# Patient Record
Sex: Female | Born: 1985 | Race: White | Hispanic: No | Marital: Married | Smoking: Never smoker
Health system: Southern US, Community
[De-identification: ages and names within clinical notes are randomized; demographics above are authoritative.]

## PROBLEM LIST (undated history)

## (undated) DIAGNOSIS — B3781 Candidal esophagitis: Secondary | ICD-10-CM

## (undated) DIAGNOSIS — J45909 Unspecified asthma, uncomplicated: Secondary | ICD-10-CM

## (undated) DIAGNOSIS — K219 Gastro-esophageal reflux disease without esophagitis: Secondary | ICD-10-CM

## (undated) DIAGNOSIS — K224 Dyskinesia of esophagus: Secondary | ICD-10-CM

## (undated) DIAGNOSIS — R1110 Vomiting, unspecified: Secondary | ICD-10-CM

## (undated) DIAGNOSIS — D131 Benign neoplasm of stomach: Secondary | ICD-10-CM

## (undated) DIAGNOSIS — K2970 Gastritis, unspecified, without bleeding: Secondary | ICD-10-CM

## (undated) HISTORY — PX: WISDOM TOOTH EXTRACTION: SHX21

---

## 2006-01-18 ENCOUNTER — Emergency Department: Payer: Self-pay | Admitting: Obstetrics & Gynecology

## 2006-06-24 ENCOUNTER — Observation Stay: Payer: Self-pay

## 2006-06-26 ENCOUNTER — Observation Stay: Payer: Self-pay | Admitting: Obstetrics & Gynecology

## 2006-07-06 ENCOUNTER — Inpatient Hospital Stay: Payer: Self-pay | Admitting: Obstetrics and Gynecology

## 2006-07-10 ENCOUNTER — Observation Stay: Payer: Self-pay | Admitting: Obstetrics and Gynecology

## 2009-01-09 ENCOUNTER — Emergency Department (HOSPITAL_COMMUNITY): Admission: EM | Admit: 2009-01-09 | Discharge: 2009-01-09 | Payer: Self-pay | Admitting: Emergency Medicine

## 2012-05-03 ENCOUNTER — Emergency Department: Payer: Self-pay | Admitting: Emergency Medicine

## 2012-05-03 LAB — URINALYSIS, COMPLETE
Glucose,UR: NEGATIVE mg/dL (ref 0–75)
Nitrite: NEGATIVE
Ph: 5 (ref 4.5–8.0)

## 2012-05-03 LAB — CBC
HCT: 38.2 % (ref 35.0–47.0)
MCH: 32 pg (ref 26.0–34.0)
MCHC: 34.8 g/dL (ref 32.0–36.0)
MCV: 92 fL (ref 80–100)
RBC: 4.15 10*6/uL (ref 3.80–5.20)
WBC: 8.3 10*3/uL (ref 3.6–11.0)

## 2012-05-04 LAB — WET PREP, GENITAL

## 2012-05-06 ENCOUNTER — Other Ambulatory Visit: Payer: Self-pay | Admitting: Emergency Medicine

## 2012-06-05 ENCOUNTER — Ambulatory Visit: Payer: Self-pay | Admitting: Internal Medicine

## 2012-06-05 LAB — CBC WITH DIFFERENTIAL/PLATELET
Basophil %: 0.4 %
HCT: 40.5 % (ref 35.0–47.0)
HGB: 13.8 g/dL (ref 12.0–16.0)
Lymphocyte %: 27.1 %
MCHC: 34 g/dL (ref 32.0–36.0)
Monocyte %: 6.2 %
Neutrophil #: 4.5 10*3/uL (ref 1.4–6.5)
RBC: 4.31 10*6/uL (ref 3.80–5.20)
WBC: 7 10*3/uL (ref 3.6–11.0)

## 2012-06-05 LAB — URINALYSIS, COMPLETE
Glucose,UR: NEGATIVE mg/dL (ref 0–75)
Nitrite: NEGATIVE
Ph: 6.5 (ref 4.5–8.0)
Protein: NEGATIVE

## 2012-06-05 LAB — COMPREHENSIVE METABOLIC PANEL
Albumin: 3.6 g/dL (ref 3.4–5.0)
Alkaline Phosphatase: 9 U/L — ABNORMAL LOW (ref 50–136)
BUN: 9 mg/dL (ref 7–18)
Bilirubin,Total: 0.6 mg/dL (ref 0.2–1.0)
Chloride: 103 mmol/L (ref 98–107)
Creatinine: 0.83 mg/dL (ref 0.60–1.30)
Glucose: 82 mg/dL (ref 65–99)
Osmolality: 273 (ref 275–301)
SGOT(AST): 13 U/L — ABNORMAL LOW (ref 15–37)
SGPT (ALT): 19 U/L (ref 12–78)
Total Protein: 7.3 g/dL (ref 6.4–8.2)

## 2012-06-05 LAB — TSH: Thyroid Stimulating Horm: 0.013 u[IU]/mL — ABNORMAL LOW

## 2012-06-05 LAB — AMYLASE: Amylase: 42 U/L (ref 25–115)

## 2012-06-09 ENCOUNTER — Ambulatory Visit: Payer: Self-pay | Admitting: Internal Medicine

## 2012-06-24 ENCOUNTER — Encounter: Payer: Self-pay | Admitting: Maternal and Fetal Medicine

## 2012-09-14 ENCOUNTER — Observation Stay: Payer: Self-pay | Admitting: Obstetrics and Gynecology

## 2012-12-25 ENCOUNTER — Observation Stay: Payer: Self-pay | Admitting: Obstetrics and Gynecology

## 2012-12-25 LAB — PIH PROFILE
Anion Gap: 11 (ref 7–16)
Calcium, Total: 8.7 mg/dL (ref 8.5–10.1)
Co2: 21 mmol/L (ref 21–32)
HGB: 11 g/dL — ABNORMAL LOW (ref 12.0–16.0)
MCH: 32 pg (ref 26.0–34.0)
MCHC: 34.6 g/dL (ref 32.0–36.0)
MCV: 93 fL (ref 80–100)
Platelet: 223 10*3/uL (ref 150–440)
Potassium: 3.4 mmol/L — ABNORMAL LOW (ref 3.5–5.1)
RDW: 13.4 % (ref 11.5–14.5)
SGOT(AST): 16 U/L (ref 15–37)

## 2013-01-02 ENCOUNTER — Inpatient Hospital Stay: Payer: Self-pay | Admitting: Obstetrics and Gynecology

## 2013-01-02 LAB — CBC WITH DIFFERENTIAL/PLATELET
Basophil #: 0.1 10*3/uL (ref 0.0–0.1)
Basophil %: 0.9 %
Eosinophil #: 0.2 10*3/uL (ref 0.0–0.7)
Eosinophil %: 2.2 %
Lymphocyte #: 1.5 10*3/uL (ref 1.0–3.6)
Lymphocyte %: 19.8 %
MCHC: 34.3 g/dL (ref 32.0–36.0)
MCV: 93 fL (ref 80–100)
Monocyte %: 7.3 %
Neutrophil #: 5.2 10*3/uL (ref 1.4–6.5)
RDW: 13.5 % (ref 11.5–14.5)

## 2013-01-02 LAB — PROTEIN / CREATININE RATIO, URINE
Creatinine, Urine: 124.1 mg/dL (ref 30.0–125.0)
Protein, Random Urine: 14 mg/dL — ABNORMAL HIGH (ref 0–12)

## 2013-01-03 LAB — HEMATOCRIT: HCT: 30.9 % — ABNORMAL LOW (ref 35.0–47.0)

## 2013-07-20 ENCOUNTER — Other Ambulatory Visit: Payer: Self-pay | Admitting: Physician Assistant

## 2013-07-22 LAB — URINE CULTURE

## 2013-10-27 ENCOUNTER — Ambulatory Visit: Payer: Self-pay | Admitting: Family Medicine

## 2013-12-03 ENCOUNTER — Emergency Department: Payer: Self-pay | Admitting: Emergency Medicine

## 2013-12-03 LAB — COMPREHENSIVE METABOLIC PANEL
ALBUMIN: 4.2 g/dL (ref 3.4–5.0)
AST: 22 U/L (ref 15–37)
Alkaline Phosphatase: 78 U/L
Anion Gap: 4 — ABNORMAL LOW (ref 7–16)
BILIRUBIN TOTAL: 0.3 mg/dL (ref 0.2–1.0)
BUN: 12 mg/dL (ref 7–18)
CHLORIDE: 112 mmol/L — AB (ref 98–107)
CREATININE: 0.92 mg/dL (ref 0.60–1.30)
Calcium, Total: 8.6 mg/dL (ref 8.5–10.1)
Co2: 23 mmol/L (ref 21–32)
EGFR (African American): >60
Glucose: 97 mg/dL (ref 65–99)
Osmolality: 277 (ref 275–301)
Potassium: 3.8 mmol/L (ref 3.5–5.1)
SGPT (ALT): 22 U/L (ref 12–78)
SODIUM: 139 mmol/L (ref 136–145)
Total Protein: 7.9 g/dL (ref 6.4–8.2)

## 2013-12-03 LAB — URINALYSIS, COMPLETE
BACTERIA: NONE SEEN
BILIRUBIN, UR: NEGATIVE
GLUCOSE, UR: NEGATIVE mg/dL (ref 0–75)
Ketone: NEGATIVE
Leukocyte Esterase: NEGATIVE
NITRITE: NEGATIVE
Ph: 6 (ref 4.5–8.0)
Protein: NEGATIVE
RBC,UR: 4 /HPF (ref 0–5)
Specific Gravity: 1.023 (ref 1.003–1.030)
WBC UR: 2 /HPF (ref 0–5)

## 2013-12-03 LAB — CBC WITH DIFFERENTIAL/PLATELET
BASOS ABS: 0 10*3/uL (ref 0.0–0.1)
Basophil %: 0.8 %
EOS ABS: 0.4 10*3/uL (ref 0.0–0.7)
Eosinophil %: 6.8 %
HCT: 41.9 % (ref 35.0–47.0)
HGB: 13.7 g/dL (ref 12.0–16.0)
LYMPHS ABS: 2.5 10*3/uL (ref 1.0–3.6)
LYMPHS PCT: 43 %
MCH: 28.9 pg (ref 26.0–34.0)
MCHC: 32.8 g/dL (ref 32.0–36.0)
MCV: 88 fL (ref 80–100)
MONO ABS: 0.5 x10 3/mm (ref 0.2–0.9)
Monocyte %: 8.7 %
NEUTROS PCT: 40.7 %
Neutrophil #: 2.4 10*3/uL (ref 1.4–6.5)
PLATELETS: 200 10*3/uL (ref 150–440)
RBC: 4.76 10*6/uL (ref 3.80–5.20)
RDW: 13.3 % (ref 11.5–14.5)
WBC: 5.9 10*3/uL (ref 3.6–11.0)

## 2013-12-03 LAB — LIPASE, BLOOD: LIPASE: 124 U/L (ref 73–393)

## 2013-12-03 LAB — PREGNANCY, URINE: PREGNANCY TEST, URINE: NEGATIVE m[IU]/mL

## 2014-04-03 IMAGING — US US OB < 14 WEEKS - US OB TV
1 series · 14 of 28 positions shown · non-contrast
Comparison: none

REASON FOR EXAM: 1ST TRIMESTER PREG, VAG BLEEDING
COMMENTS:   May transport without cardiac monitor

PROCEDURE:     US  - US OB LESS THAN 14 WEEKS/W TRANS  - May 04, 2012 [DATE]
RESULT:
TECHNIQUE: Transabdominal and endovaginal imaging of the pelvis was obtained.

[Series 1: us ob < 14 weeks - us ob tv · 0.30mm/px · 14 of 40 slices shown]
[im 2/40]
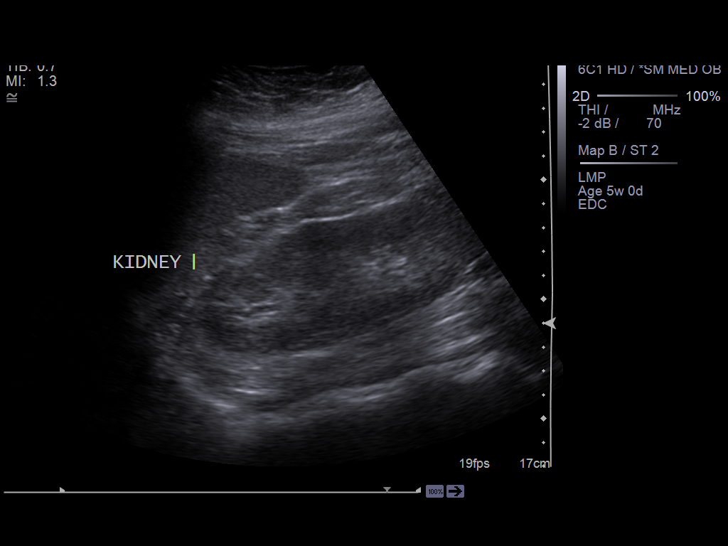
[im 5/40]
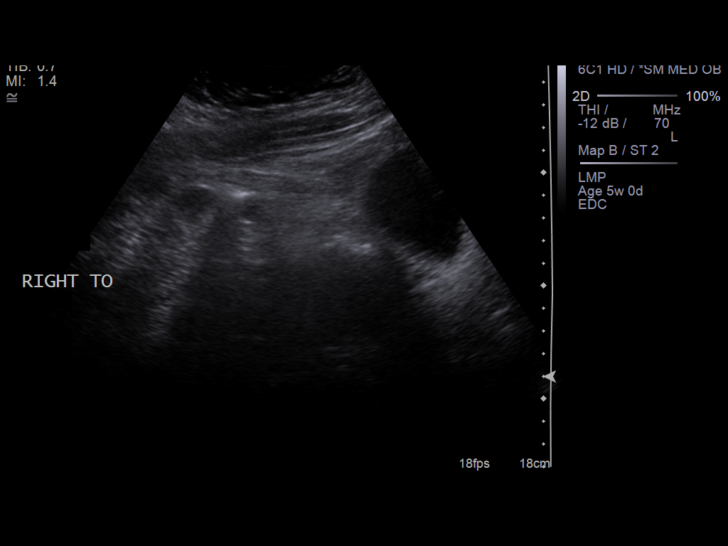
[im 8/40]
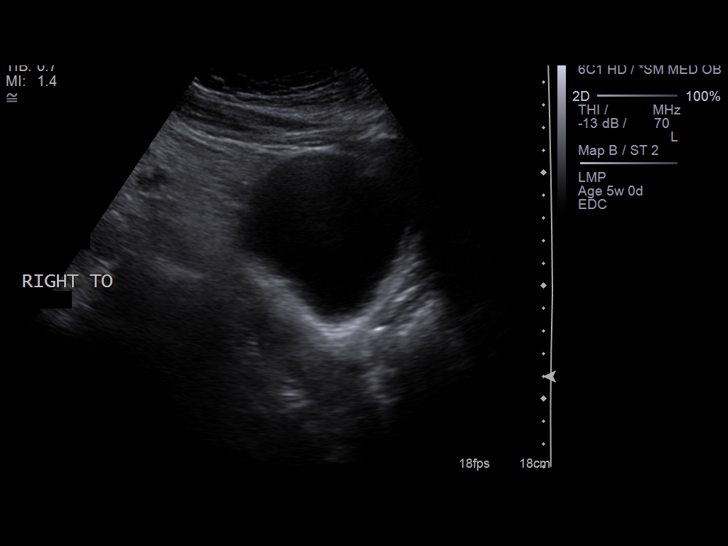
[im 11/40]
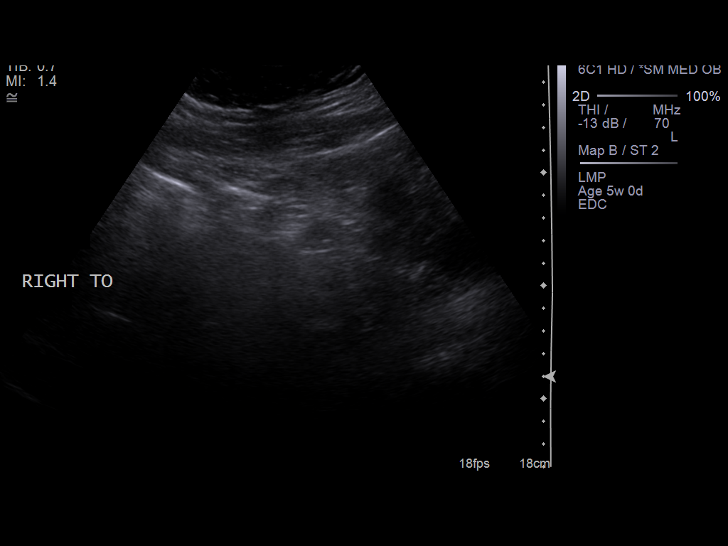
[im 14/40]
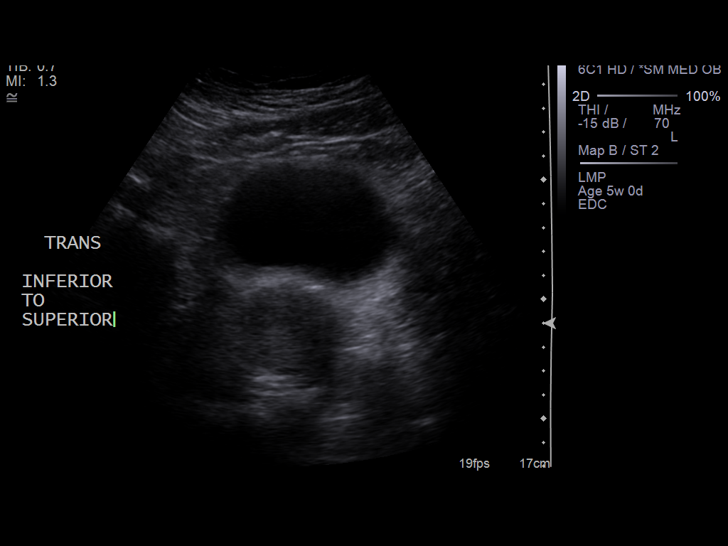
[im 16/40]
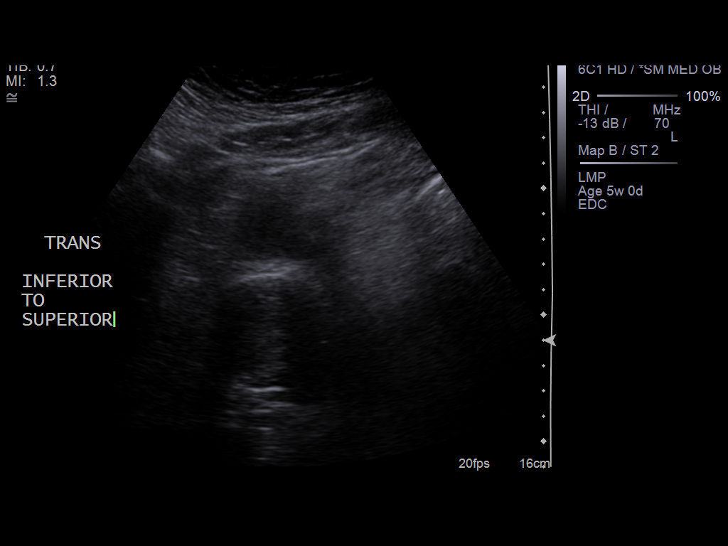
[im 19/40]
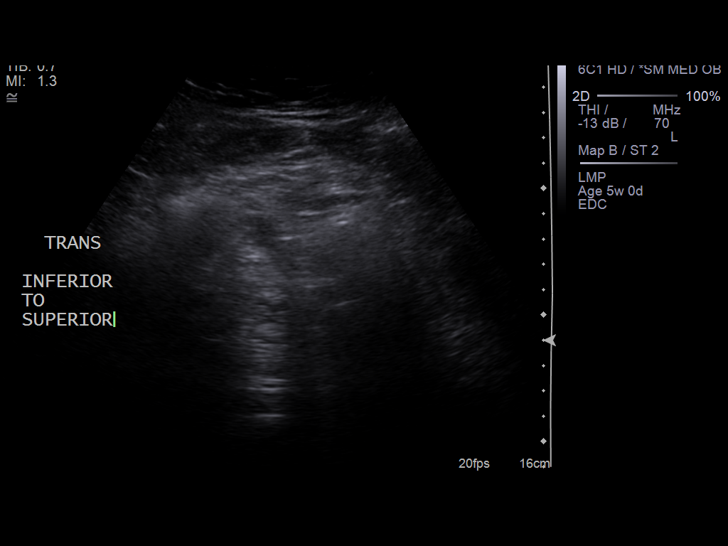
[im 22/40]
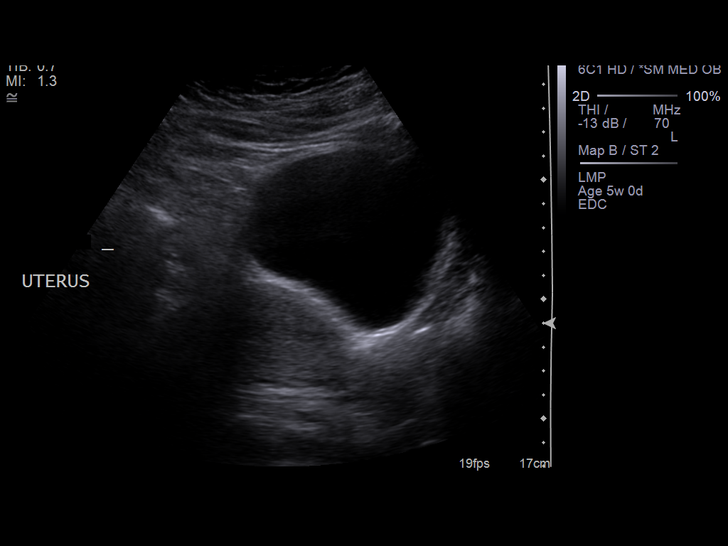
[im 25/40]
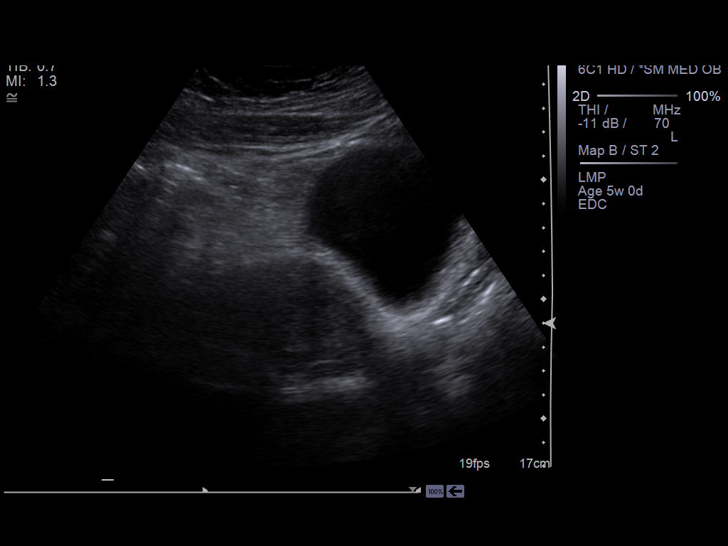
[im 28/40]
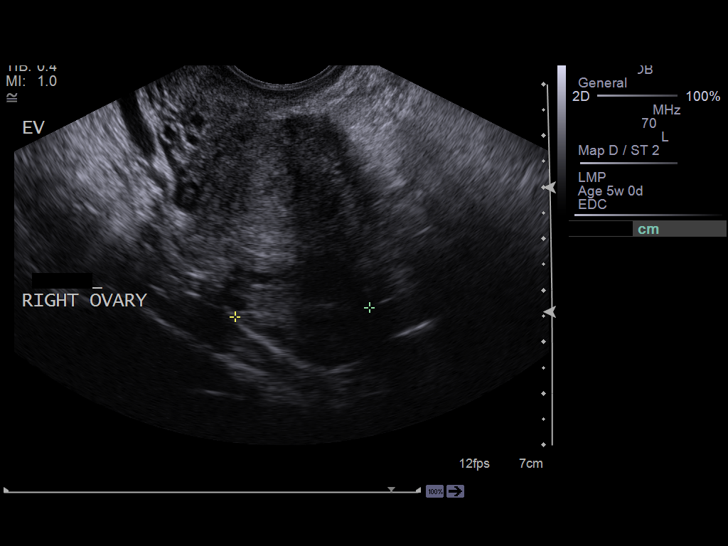
[im 31/40]
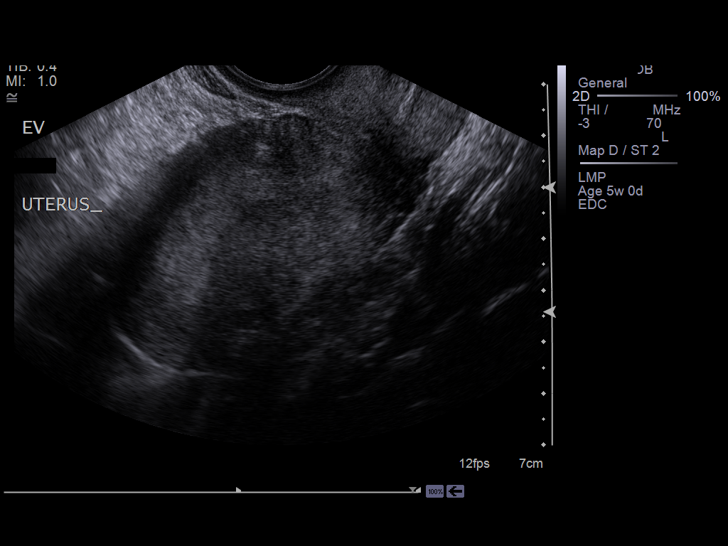
[im 34/40]
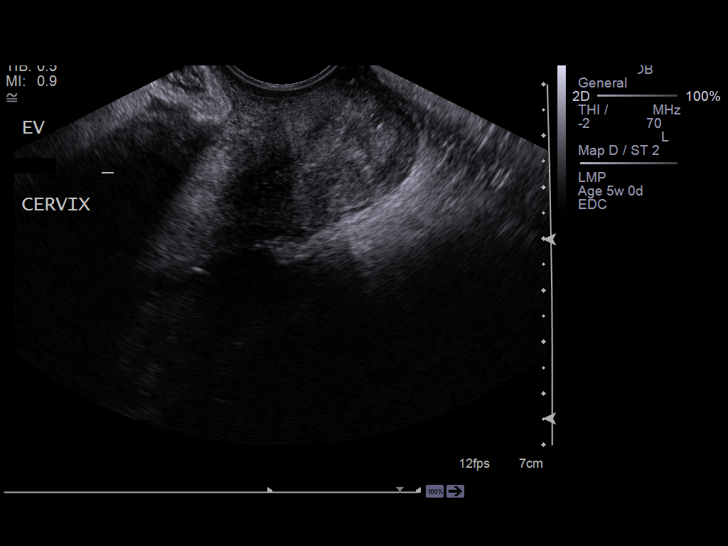
[im 37/40]
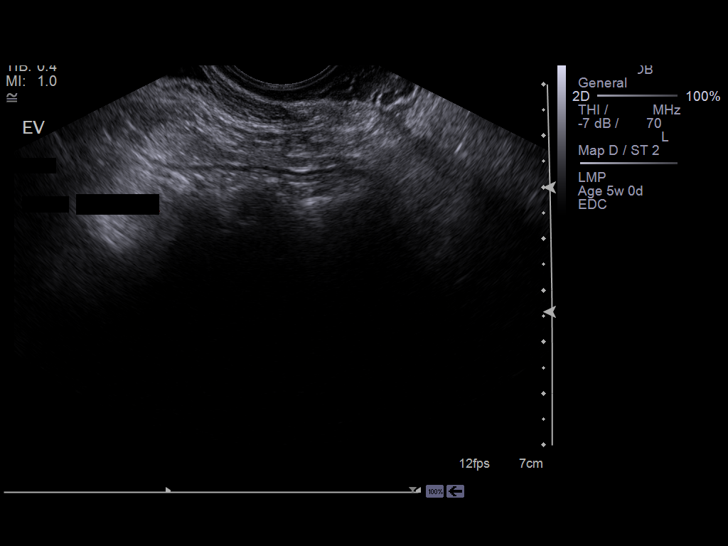
[im 40/40]
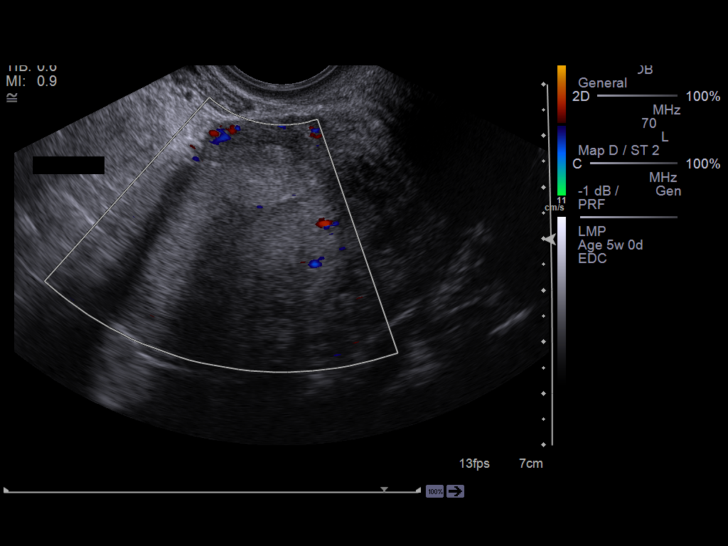

[14 of 28 positions shown; findings below may reference images not displayed]

FINDINGS: The uterus demonstrates a homogeneous echotexture with an
endometrial thickness of 1.15 cm. There is no sonographic evidence of an
intrauterine gestational sac. The ovaries are unremarkable with the right
measuring 3.57 x 2.03 x 2.61 cm and the left not visualized secondary to
bowel gas. A small amount fluid is appreciated within the cul-de-sac.
IMPRESSION: 1. No sonographic evidence of an intrauterine pregnancy.
2. An intrauterine or ectopic pregnancy cannot be excluded or confirmed from
this study at this time. Correlation with a serial beta-hCG is recommended.
3. Dr. Elsie of the Emergency Department was informed of these findings
via a preliminary faxed report.

## 2014-10-05 ENCOUNTER — Ambulatory Visit: Payer: Self-pay | Admitting: Physician Assistant

## 2015-01-02 ENCOUNTER — Encounter: Payer: Self-pay | Admitting: *Deleted

## 2015-02-06 NOTE — H&P (Signed)
L&D Evaluation:  History:  HPI 29 yo G2P1001 with LMP of 04/01/12? & EDD of 01/06/13 [facility] with "elevated BP today at work of 140/96 and spots in front of her eyes". Pt was working as an Customer service manager. Denies any edema or HA or RUQ pain today.   Presents with elevated BP, hx of pre-ecclampsia   Patient's Medical History asthma,Hyperemesis,   Patient's Surgical History 2 wisdom teeth   Medications Pre Natal Vitamins   Allergies NKDA   Social History none   Family History Non-Contributory   ROS:  ROS All systems were reviewed.  HEENT, CNS, GI, GU, Respiratory, CV, Renal and Musculoskeletal systems were found to be normal.   Exam:  Vital Signs stable  Bp 139/76   General no apparent distress   Mental Status clear   Chest clear   Heart normal sinus rhythm, no murmur/gallop/rubs   Abdomen gravid, non-tender   Estimated Fetal Weight Average for gestational age   Fetal Position vtx   Back no CVAT   Edema 1+   Reflexes 1+   Pelvic 2/50/vtx-2   Mebranes Intact   FHT normal rate with no decels, reactive NST with 2 accels 15 x 15   Ucx absent   Skin dry   Lymph no lymphadenopathy   Impression:  Impression Hest HYN at 39 3/7 weeks   Plan:  Plan monitor contractions and for cervical change, Prot/creat ratio is 113   Comments Disc IOL due to spots in front of eyes and elevated BP's vs Bedrest and IOL as planned. Pt desires to stay and be induced today. Pt is aware of the risks, benfits and alternatives including bleeding, infection, fetal or uterine intolerance.   Electronic Signatures: Catheryn Bacon (CNM)  (Signed 06-Apr-14 09:48)  Authored: L&D Evaluation   Last Updated: 06-Apr-14 09:48 by Catheryn Bacon (CNM)

## 2015-02-06 NOTE — H&P (Signed)
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  HEENT, CNS, GI, GU, Respiratory, CV, Renal and Musculoskeletal systems were found to be normal.   Exam:   Vital Signs stable    Abdomen gravid, non-tender    Estimated Fetal Weight Average for gestational age    Pelvic closed/parous/OOP    FHT normal rate with no decels    Ucx absent   Impression:   Impression VB at 23 weeks   Plan:   Comments Note for work today No lifting at work for remainder of pregnancy Pelvic rest for 1 week F/U KC next 1-2 weeks   Electronic Signatures: Edison Nasuti (MD)  (Signed 17-Dec-13 03:12)  Authored: L&D Evaluation   Last Updated: 17-Dec-13 03:12 by Edison Nasuti (MD)

## 2015-05-23 ENCOUNTER — Other Ambulatory Visit: Payer: Self-pay | Admitting: Physician Assistant

## 2015-05-23 DIAGNOSIS — R1319 Other dysphagia: Secondary | ICD-10-CM

## 2015-05-25 ENCOUNTER — Ambulatory Visit: Admission: RE | Admit: 2015-05-25 | Payer: Self-pay | Source: Ambulatory Visit

## 2015-06-01 ENCOUNTER — Ambulatory Visit
Admission: RE | Admit: 2015-06-01 | Discharge: 2015-06-01 | Disposition: A | Discharge status: Home / Self Care | Payer: BLUE CROSS/BLUE SHIELD | Source: Ambulatory Visit | Attending: Physician Assistant | Admitting: Physician Assistant

## 2015-06-01 DIAGNOSIS — R1319 Other dysphagia: Secondary | ICD-10-CM | POA: Insufficient documentation

## 2015-06-25 ENCOUNTER — Encounter: Payer: Self-pay | Admitting: *Deleted

## 2015-06-26 ENCOUNTER — Ambulatory Visit: Payer: BLUE CROSS/BLUE SHIELD | Admitting: Anesthesiology

## 2015-06-26 ENCOUNTER — Ambulatory Visit
Admission: RE | Admit: 2015-06-26 | Discharge: 2015-06-26 | Disposition: A | Discharge status: Home / Self Care | Payer: BLUE CROSS/BLUE SHIELD | Source: Ambulatory Visit | Attending: Gastroenterology | Admitting: Gastroenterology

## 2015-06-26 ENCOUNTER — Encounter
Admission: RE | Disposition: A | Discharge status: Home / Self Care | Payer: Self-pay | Source: Ambulatory Visit | Attending: Gastroenterology

## 2015-06-26 ENCOUNTER — Encounter: Payer: Self-pay | Admitting: Anesthesiology

## 2015-06-26 DIAGNOSIS — J45909 Unspecified asthma, uncomplicated: Secondary | ICD-10-CM | POA: Insufficient documentation

## 2015-06-26 DIAGNOSIS — B3781 Candidal esophagitis: Secondary | ICD-10-CM | POA: Insufficient documentation

## 2015-06-26 DIAGNOSIS — Z79899 Other long term (current) drug therapy: Secondary | ICD-10-CM | POA: Insufficient documentation

## 2015-06-26 DIAGNOSIS — Z7951 Long term (current) use of inhaled steroids: Secondary | ICD-10-CM | POA: Insufficient documentation

## 2015-06-26 DIAGNOSIS — K2950 Unspecified chronic gastritis without bleeding: Secondary | ICD-10-CM | POA: Diagnosis not present

## 2015-06-26 DIAGNOSIS — K317 Polyp of stomach and duodenum: Secondary | ICD-10-CM | POA: Diagnosis not present

## 2015-06-26 DIAGNOSIS — K200 Eosinophilic esophagitis: Secondary | ICD-10-CM | POA: Diagnosis not present

## 2015-06-26 DIAGNOSIS — R1310 Dysphagia, unspecified: Secondary | ICD-10-CM | POA: Insufficient documentation

## 2015-06-26 DIAGNOSIS — K219 Gastro-esophageal reflux disease without esophagitis: Secondary | ICD-10-CM | POA: Insufficient documentation

## 2015-06-26 DIAGNOSIS — K224 Dyskinesia of esophagus: Secondary | ICD-10-CM | POA: Insufficient documentation

## 2015-06-26 HISTORY — DX: Vomiting, unspecified: R11.10

## 2015-06-26 HISTORY — DX: Unspecified asthma, uncomplicated: J45.909

## 2015-06-26 HISTORY — DX: Gastro-esophageal reflux disease without esophagitis: K21.9

## 2015-06-26 HISTORY — PX: ESOPHAGOGASTRODUODENOSCOPY (EGD) WITH PROPOFOL: SHX5813

## 2015-06-26 LAB — KOH PREP: KOH PREP: POSITIVE

## 2015-06-26 LAB — POCT PREGNANCY, URINE: PREG TEST UR: NEGATIVE

## 2015-06-26 SURGERY — ESOPHAGOGASTRODUODENOSCOPY (EGD) WITH PROPOFOL
Anesthesia: General

## 2015-06-26 MED ORDER — MIDAZOLAM HCL 2 MG/2ML IJ SOLN
INTRAMUSCULAR | Status: DC | PRN
  Administered 2015-06-26: 1 mg via INTRAVENOUS

## 2015-06-26 MED ORDER — IPRATROPIUM-ALBUTEROL 0.5-2.5 (3) MG/3ML IN SOLN
RESPIRATORY_TRACT | Status: AC
  Administered 2015-06-26: 3 mL via RESPIRATORY_TRACT
  Filled 2015-06-26: qty 3

## 2015-06-26 MED ORDER — IPRATROPIUM-ALBUTEROL 0.5-2.5 (3) MG/3ML IN SOLN
3.0000 mL | Freq: Once | RESPIRATORY_TRACT | Status: AC
  Administered 2015-06-26: 3 mL via RESPIRATORY_TRACT

## 2015-06-26 MED ORDER — FENTANYL CITRATE (PF) 100 MCG/2ML IJ SOLN
INTRAMUSCULAR | Status: DC | PRN
  Administered 2015-06-26: 50 ug via INTRAVENOUS

## 2015-06-26 MED ORDER — SODIUM CHLORIDE 0.9 % IV SOLN
INTRAVENOUS | Status: DC
  Administered 2015-06-26: 1000 mL via INTRAVENOUS

## 2015-06-26 MED ORDER — PROPOFOL 500 MG/50ML IV EMUL
INTRAVENOUS | Status: DC | PRN
  Administered 2015-06-26: 120 ug/kg/min via INTRAVENOUS

## 2015-06-26 MED ORDER — GLYCOPYRROLATE 0.2 MG/ML IJ SOLN
INTRAMUSCULAR | Status: DC | PRN
  Administered 2015-06-26: 0.1 mg via INTRAVENOUS

## 2015-06-26 MED ORDER — LIDOCAINE HCL (CARDIAC) 20 MG/ML IV SOLN
INTRAVENOUS | Status: DC | PRN
  Administered 2015-06-26: 40 mg via INTRAVENOUS

## 2015-06-26 NOTE — Anesthesia Postprocedure Evaluation (Signed)
  Anesthesia Post-op Note  Patient: Paula Padilla  Procedure(s) Performed: Procedure(s): ESOPHAGOGASTRODUODENOSCOPY (EGD) WITH PROPOFOL (N/A)  Anesthesia type:General  Patient location: PACU  Post pain: Pain level controlled  Post assessment: Post-op Vital signs reviewed, Patient's Cardiovascular Status Stable, Respiratory Function Stable, Patent Airway and No signs of Nausea or vomiting  Post vital signs: Reviewed and stable  Last Vitals:  Filed Vitals:   06/26/15 1140  BP: 147/101  Pulse: 78  Temp:   Resp: 19    Level of consciousness: awake, alert  and patient cooperative  Complications: No apparent anesthesia complications

## 2015-06-26 NOTE — Anesthesia Procedure Notes (Signed)
Performed by: COOK-MARTIN, Isel Skufca Pre-anesthesia Checklist: Patient identified, Emergency Drugs available, Suction available, Patient being monitored and Timeout performed Patient Re-evaluated:Patient Re-evaluated prior to inductionOxygen Delivery Method: Nasal cannula Preoxygenation: Pre-oxygenation with 100% oxygen Intubation Type: IV induction Airway Equipment and Method: Bite block Placement Confirmation: positive ETCO2 and CO2 detector     

## 2015-06-26 NOTE — Op Note (Signed)
[facility], [place] Gastroenterology Patient Name: Paula Padilla Procedure Date: 06/26/2015 10:45 AM MRN: [id number] Account #: [id number] Date of Birth: 1986-01-17 Admit Type: Outpatient Age: 29 Room: [facility] ENDO ROOM 3 Gender: Female Note Status: Finalized Procedure:         Upper GI endoscopy Indications:       Dysphagia Providers:         Lollie Sails, MD Referring MD:      Precious Bard, MD (Referring MD) Medicines:         Monitored Anesthesia Care Complications:     No immediate complications. Procedure:         Pre-Anesthesia Assessment:                    - ASA Grade Assessment: III - A patient with severe                     systemic disease.                    After obtaining informed consent, the endoscope was passed                     under direct vision. Throughout the procedure, the                     patient's blood pressure, pulse, and oxygen saturations                     were monitored continuously. The Olympus GIF-160 endoscope                     (S#. [id number]) was introduced through the mouth, and                     advanced to the third part of duodenum. The upper GI                     endoscopy was accomplished without difficulty. The patient                     tolerated the procedure well. Findings:      Patchy candidiasis was found in the upper third of the esophagus and in       the middle third of the esophagus. Cells for cytology were obtained by       brushing.      LA Grade B (one or more mucosal breaks greater than 5 mm, not extending       between the tops of two mucosal folds) esophagitis with no bleeding was       found.      Abnormal motility was noted in the middle third of the esophagus and in       the lower third of the esophagus. The cricopharyngeus was normal. There       are extra peristaltic waves of the esophageal body. Tertiary peristaltic       waves are noted.      Mildly friable mucosa with contact  bleeding was found in the gastric       body.      The exam of the stomach was otherwise normal.      The cardia and gastric fundus were normal on retroflexion.      Patchy mucosal flattening was found at 2nd part of the duodenum and at  3rd part of the duodenum. Biopsies were taken with a cold forceps for       histology.      Biopsies were taken with a cold forceps in the middle third of the       esophagus for histology. Impression:        - Monilial esophagitis. Cells for cytology obtained.                    - LA Grade B erosive esophagitis.                    - Esophageal motility disorder.                    - Friable gastric mucosa.                    - Flattened mucosa was found in the duodenum, not                     consistent with celiac disease. Biopsied. Recommendation:    - Use Protonix (pantoprazole) 40 mg PO daily daily.                    - Diflucan (fluconazole) 100 mg PO daily for 5 days.                    - Return to GI clinic in 3 weeks. Procedure Code(s): --- Professional ---                    [phone], Esophagogastroduodenoscopy, flexible, transoral;                     with biopsy, single or multiple Diagnosis Code(s): --- Professional ---                    112.84, Candidal esophagitis                    530.19, Other esophagitis                    530.5, Dyskinesia of esophagus                    537.9, Unspecified disorder of stomach and duodenum                    787.20, Dysphagia, unspecified CPT copyright 2014 American Medical Association. All rights reserved. The codes documented in this report are preliminary and upon coder review may  be revised to meet current compliance requirements. Lollie Sails, MD 06/26/2015 11:16:07 AM This report has been signed electronically. Number of Addenda: 0 Note Initiated On: 06/26/2015 10:45 AM      [facility]

## 2015-06-26 NOTE — Transfer of Care (Signed)
Immediate Anesthesia Transfer of Care Note  Patient: Paula Padilla  Procedure(s) Performed: Procedure(s): ESOPHAGOGASTRODUODENOSCOPY (EGD) WITH PROPOFOL (N/A)  Patient Location: PACU  Anesthesia Type:General  Level of Consciousness: awake, alert , oriented and sedated  Airway & Oxygen Therapy: Patient Spontanous Breathing and Patient connected to nasal cannula oxygen  Post-op Assessment: Report given to RN and Post -op Vital signs reviewed and stable  Post vital signs: Reviewed and stable  Last Vitals:  Filed Vitals:   06/26/15 0937  BP: 146/99  Pulse: 107  Temp: 37.6 C  Resp: 16    Complications: No apparent anesthesia complications

## 2015-06-26 NOTE — Anesthesia Preprocedure Evaluation (Addendum)
Anesthesia Evaluation  Patient identified by MRN, date of birth, ID band Patient awake    Reviewed: Allergy & Precautions, H&P , NPO status , Patient's Chart, lab work & pertinent test results  History of Anesthesia Complications Negative for: history of anesthetic complications  Airway Mallampati: II  TM Distance: >3 FB Neck ROM: full    Dental no notable dental hx. (+) Teeth Intact   Pulmonary neg shortness of breath, asthma ,    Pulmonary exam normal breath sounds clear to auscultation       Cardiovascular Exercise Tolerance: Good (-) Past MI negative cardio ROS Normal cardiovascular exam Rhythm:regular Rate:Normal     Neuro/Psych negative neurological ROS  negative psych ROS   GI/Hepatic Neg liver ROS, GERD  Controlled,  Endo/Other  negative endocrine ROS  Renal/GU negative Renal ROS  negative genitourinary   Musculoskeletal   Abdominal   Peds  Hematology negative hematology ROS (+)   Anesthesia Other Findings Past Medical History:   Asthma                                                       GERD (gastroesophageal reflux disease)                       Hyperemesis                                                 Patient reports that she does that think that any food or pills are stuck in her throat at this time.   Reproductive/Obstetrics negative OB ROS                             Anesthesia Physical Anesthesia Plan  ASA: III  Anesthesia Plan: General   Post-op Pain Management:    Induction:   Airway Management Planned:   Additional Equipment:   Intra-op Plan:   Post-operative Plan:   Informed Consent: I have reviewed the patients History and Physical, chart, labs and discussed the procedure including the risks, benefits and alternatives for the proposed anesthesia with the patient or authorized representative who has indicated his/her understanding and acceptance.    Dental Advisory Given  Plan Discussed with: Anesthesiologist, CRNA and Surgeon  Anesthesia Plan Comments:         Anesthesia Quick Evaluation

## 2015-06-27 LAB — SURGICAL PATHOLOGY

## 2015-06-28 ENCOUNTER — Encounter: Payer: Self-pay | Admitting: Gastroenterology

## 2015-06-29 NOTE — H&P (Signed)
Outpatient short stay form Pre-procedure 06/29/2015 6:20 PM this is a delayed note her procedure date of 06/26/2015 Paula Sails MD  Primary Physician: Rolan Lipa NP  Reason for visit:  EGD  History of present illness:  Patient is a 29 year old female presenting for EGD. He is been having some difficulty with esophageal reflux and some amount of dysphagia. As an outpatient she had a barium swallow, Helicobacter pylori antibody serologies, and referral for this procedure.  The barium swallow showed some decreased peristalsis however no evidence of reflux mass or ulceration. A 13 mm tablet briefly in the pharynx pass freely into the stomach from there after several boluses of water.  Back to pylori serology was negative.   No current facility-administered medications for this encounter.  Current outpatient prescriptions:  .  albuterol (PROVENTIL HFA;VENTOLIN HFA) 108 (90 BASE) MCG/ACT inhaler, Inhale 2 puffs into the lungs every 6 (six) hours as needed for wheezing or shortness of breath., Disp: , Rfl:  .  Azelastine-Fluticasone 137-50 MCG/ACT SUSP, Place 2 sprays into the nose daily., Disp: , Rfl:  .  pantoprazole (PROTONIX) 40 MG tablet, Take 40 mg by mouth 2 (two) times daily., Disp: , Rfl:   No prescriptions prior to admission     No Known Allergies   Past Medical History  Diagnosis Date  . Asthma   . GERD (gastroesophageal reflux disease)   . Hyperemesis     Review of systems:      Physical Exam    Heart and lungs: Regular rate and rhythm without rub or gallop, lungs are bilaterally clear    HEENT: Norm cephalic atraumatic eyes are anicteric    Other:     Pertinant exam for procedure: Soft nontender nondistended bowel sounds positive    Planned proceedures: EGD and indicated procedures I have discussed the risks benefits and complications of procedures to include not limited to bleeding, infection, perforation and the risk of sedation and the patient  wishes to proceed.    Paula Sails, MD Gastroenterology 06/29/2015  6:20 PM

## 2015-08-22 ENCOUNTER — Other Ambulatory Visit
Admission: RE | Admit: 2015-08-22 | Discharge: 2015-08-22 | Disposition: A | Discharge status: Home / Self Care | Payer: BLUE CROSS/BLUE SHIELD | Source: Ambulatory Visit | Attending: Otolaryngology | Admitting: Otolaryngology

## 2015-08-22 ENCOUNTER — Encounter: Payer: Self-pay | Admitting: *Deleted

## 2015-08-27 ENCOUNTER — Encounter
Admission: RE | Disposition: A | Discharge status: Home / Self Care | Payer: Self-pay | Source: Ambulatory Visit | Attending: Gastroenterology

## 2015-08-27 ENCOUNTER — Other Ambulatory Visit
Admission: RE | Admit: 2015-08-27 | Discharge: 2015-08-27 | Disposition: A | Discharge status: Home / Self Care | Payer: BLUE CROSS/BLUE SHIELD | Source: Ambulatory Visit | Attending: Otolaryngology | Admitting: Otolaryngology

## 2015-08-27 ENCOUNTER — Encounter: Payer: Self-pay | Admitting: *Deleted

## 2015-08-27 ENCOUNTER — Ambulatory Visit: Payer: BLUE CROSS/BLUE SHIELD | Admitting: Anesthesiology

## 2015-08-27 ENCOUNTER — Ambulatory Visit
Admission: RE | Admit: 2015-08-27 | Discharge: 2015-08-27 | Disposition: A | Discharge status: Home / Self Care | Payer: BLUE CROSS/BLUE SHIELD | Source: Ambulatory Visit | Attending: Gastroenterology | Admitting: Gastroenterology

## 2015-08-27 DIAGNOSIS — K200 Eosinophilic esophagitis: Secondary | ICD-10-CM | POA: Diagnosis not present

## 2015-08-27 DIAGNOSIS — Z79899 Other long term (current) drug therapy: Secondary | ICD-10-CM | POA: Diagnosis not present

## 2015-08-27 DIAGNOSIS — Z8719 Personal history of other diseases of the digestive system: Secondary | ICD-10-CM | POA: Diagnosis not present

## 2015-08-27 DIAGNOSIS — J45909 Unspecified asthma, uncomplicated: Secondary | ICD-10-CM | POA: Diagnosis not present

## 2015-08-27 DIAGNOSIS — K210 Gastro-esophageal reflux disease with esophagitis: Secondary | ICD-10-CM | POA: Insufficient documentation

## 2015-08-27 DIAGNOSIS — K209 Esophagitis, unspecified: Secondary | ICD-10-CM | POA: Diagnosis present

## 2015-08-27 DIAGNOSIS — K2950 Unspecified chronic gastritis without bleeding: Secondary | ICD-10-CM | POA: Diagnosis not present

## 2015-08-27 DIAGNOSIS — Z7951 Long term (current) use of inhaled steroids: Secondary | ICD-10-CM | POA: Diagnosis not present

## 2015-08-27 HISTORY — DX: Gastritis, unspecified, without bleeding: K29.70

## 2015-08-27 HISTORY — DX: Candidal esophagitis: B37.81

## 2015-08-27 HISTORY — PX: ESOPHAGOGASTRODUODENOSCOPY (EGD) WITH PROPOFOL: SHX5813

## 2015-08-27 HISTORY — DX: Benign neoplasm of stomach: D13.1

## 2015-08-27 HISTORY — DX: Unspecified asthma, uncomplicated: J45.909

## 2015-08-27 HISTORY — DX: Dyskinesia of esophagus: K22.4

## 2015-08-27 LAB — KOH PREP: KOH Prep: NONE SEEN

## 2015-08-27 LAB — POCT PREGNANCY, URINE: PREG TEST UR: NEGATIVE

## 2015-08-27 SURGERY — ESOPHAGOGASTRODUODENOSCOPY (EGD) WITH PROPOFOL
Anesthesia: General

## 2015-08-27 MED ORDER — LACTATED RINGERS IV SOLN
INTRAVENOUS | Status: DC | PRN
  Administered 2015-08-27: 10:00:00 via INTRAVENOUS

## 2015-08-27 MED ORDER — SODIUM CHLORIDE 0.9 % IV SOLN
INTRAVENOUS | Status: DC
  Administered 2015-08-27: 1000 mL via INTRAVENOUS

## 2015-08-27 MED ORDER — PROPOFOL 10 MG/ML IV BOLUS
INTRAVENOUS | Status: DC | PRN
  Administered 2015-08-27: 100 mg via INTRAVENOUS
  Administered 2015-08-27: 50 mg via INTRAVENOUS

## 2015-08-27 MED ORDER — SODIUM CHLORIDE 0.9 % IV SOLN: INTRAVENOUS | Status: DC

## 2015-08-27 MED ORDER — PROPOFOL 500 MG/50ML IV EMUL
INTRAVENOUS | Status: DC | PRN
  Administered 2015-08-27: 125 ug/kg/min via INTRAVENOUS

## 2015-08-27 NOTE — Anesthesia Postprocedure Evaluation (Signed)
Anesthesia Post Note  Patient: Paula Padilla  Procedure(s) Performed: Procedure(s) (LRB): ESOPHAGOGASTRODUODENOSCOPY (EGD) WITH PROPOFOL (N/A)  Patient location during evaluation: PACU Anesthesia Type: General Level of consciousness: awake and alert Pain management: pain level controlled Vital Signs Assessment: post-procedure vital signs reviewed and stable Respiratory status: spontaneous breathing Cardiovascular status: stable Anesthetic complications: no    Last Vitals:  Filed Vitals:   08/27/15 0844 08/27/15 1037  BP: 144/86 103/64  Pulse: 83 80  Temp: 36.8 C 36.5 C  Resp: 16 16    Last Pain: There were no vitals filed for this visit.               VAN STAVEREN,Daysie Helf

## 2015-08-27 NOTE — Anesthesia Preprocedure Evaluation (Signed)
Anesthesia Evaluation  Patient identified by MRN, date of birth, ID band Patient awake    Reviewed: Allergy & Precautions, NPO status , Patient's Chart, lab work & pertinent test results  Airway Mallampati: II       Dental  (+) Teeth Intact   Pulmonary asthma ,    breath sounds clear to auscultation       Cardiovascular Exercise Tolerance: Good  Rhythm:Regular Rate:Normal     Neuro/Psych    GI/Hepatic Neg liver ROS,   Endo/Other  negative endocrine ROSMorbid obesity  Renal/GU negative Renal ROS     Musculoskeletal negative musculoskeletal ROS (+)   Abdominal (+) + obese,   Peds  Hematology negative hematology ROS (+)   Anesthesia Other Findings   Reproductive/Obstetrics                             Anesthesia Physical Anesthesia Plan  ASA: II  Anesthesia Plan: General   Post-op Pain Management:    Induction: Intravenous  Airway Management Planned: Nasal Cannula  Additional Equipment:   Intra-op Plan:   Post-operative Plan:   Informed Consent: I have reviewed the patients History and Physical, chart, labs and discussed the procedure including the risks, benefits and alternatives for the proposed anesthesia with the patient or authorized representative who has indicated his/her understanding and acceptance.     Plan Discussed with: CRNA  Anesthesia Plan Comments:         Anesthesia Quick Evaluation

## 2015-08-27 NOTE — H&P (Signed)
Outpatient short stay form Pre-procedure 08/27/2015 10:03 AM Paula Sails MD  Primary Physician: Jackelyn Poling NP  Reason for visit:  EGD  History of present illness:  Patient is a 29 year old female presenting today for EGD. She had an EGD done about mid September 2016. At that time she was found to have esophagitis and biopsies were consistent with some eosinophilia. She is doing much better with symptoms. We are proceeding with EGD today to repeat biopsies to assess response to treatment. He takes no aspirin or blood thinners. She is continuing to take Protonix 40 mg twice a day    Current facility-administered medications:  .  0.9 %  sodium chloride infusion, , Intravenous, Continuous, Paula Sails, MD, Last Rate: 20 mL/hr at 08/27/15 0903, 1,000 mL at 08/27/15 0903 .  0.9 %  sodium chloride infusion, , Intravenous, Continuous, Paula Sails, MD  Prescriptions prior to admission  Medication Sig Dispense Refill Last Dose  . fexofenadine (ALLEGRA) 180 MG tablet Take 180 mg by mouth daily.     Marland Kitchen albuterol (PROVENTIL HFA;VENTOLIN HFA) 108 (90 BASE) MCG/ACT inhaler Inhale 2 puffs into the lungs every 6 (six) hours as needed for wheezing or shortness of breath.   Past Month at Unknown time  . Azelastine-Fluticasone 137-50 MCG/ACT SUSP Place 2 sprays into the nose daily.   Past Week at Unknown time  . fluconazole (DIFLUCAN) 100 MG tablet Take 100 mg by mouth daily.   Not Taking at Unknown time  . pantoprazole (PROTONIX) 40 MG tablet Take 40 mg by mouth 2 (two) times daily.   Past Week at Unknown time     No Known Allergies   Past Medical History  Diagnosis Date  . GERD (gastroesophageal reflux disease)   . Hyperemesis   . Asthma   . Asthma without status asthmaticus     uses inhaler every spring 3-4x/week  . Fundic gland polyps of stomach, benign   . Gastritis   . Monilial esophagitis ([place])   . Esophageal motility disorder     Review of systems:      Physical  Exam    Heart and lungs: Regular rate and rhythm without rub or gallop, lungs are bilaterally clear    HEENT: Normocephalic atraumatic eyes are anicteric.    Other:     Pertinant exam for procedure: Soft nontender nondistended bowel sounds positive normoactive.    Planned proceedures: EGD and indicated procedures. I have discussed the risks benefits and complications of procedures to include not limited to bleeding, infection, perforation and the risk of sedation and the patient wishes to proceed.    Paula Sails, MD Gastroenterology 08/27/2015  10:03 AM

## 2015-08-27 NOTE — Transfer of Care (Signed)
Immediate Anesthesia Transfer of Care Note  Patient: Paula Padilla  Procedure(s) Performed: Procedure(s): ESOPHAGOGASTRODUODENOSCOPY (EGD) WITH PROPOFOL (N/A)  Patient Location: PACU and Endoscopy Unit  Anesthesia Type:General  Level of Consciousness: awake, alert  and oriented  Airway & Oxygen Therapy: Patient Spontanous Breathing and Patient connected to nasal cannula oxygen  Post-op Assessment: Report given to RN and Post -op Vital signs reviewed and stable  Post vital signs: Reviewed and stable  Last Vitals:  Filed Vitals:   08/27/15 0844  BP: 144/86  Pulse: 83  Temp: 36.8 C  Resp: 16    Complications: No apparent anesthesia complications

## 2015-08-27 NOTE — Op Note (Signed)
[facility] Gastroenterology Patient Name: Paula Padilla Procedure Date: 08/27/2015 10:04 AM MRN: [id number] Account #: [id number] Date of Birth: Oct 21, 1985 Admit Type: Outpatient Age: 29 Room: [facility] ENDO ROOM 3 Gender: Female Note Status: Finalized Procedure:         Upper GI endoscopy Indications:       Follow-up of reflux esophagitis, Suspected eosinophilic                     esophagitis, Follow-up of eosinophilic esophagitis Providers:         Lollie Sails, MD Referring MD:      Precious Bard, MD (Referring MD) Medicines:         Monitored Anesthesia Care Complications:     No immediate complications. Procedure:         Pre-Anesthesia Assessment:                    - ASA Grade Assessment: II - A patient with mild systemic                     disease.                    After obtaining informed consent, the endoscope was passed                     under direct vision. Throughout the procedure, the                     patient's blood pressure, pulse, and oxygen saturations                     were monitored continuously. The Endoscope was introduced                     through the mouth, and advanced to the third part of                     duodenum. The upper GI endoscopy was accomplished without                     difficulty. The patient tolerated the procedure well. Findings:      Patchy candidiasis was found in the middle third of the esophagus and in       the lower third of the esophagus. Cells for cytology were obtained by       brushing.      The Z-line was variable. Biopsies were taken with a cold forceps for       histology. This was biopsied with a cold forceps for evaluation of       eosinophilic esophagitis from about 28-30 cm from the incisors. .      Possible minimal evidence of furrowing in the upper half of the       esophagus.      Patchy mild inflammation characterized by erosions and erythema was       found in the gastric  antrum. Biopsies were taken with a cold forceps for       histology. Biopsies were taken with a cold forceps for Helicobacter       pylori testing.      The examined duodenum was normal. Biopsies were taken with a cold       forceps for histology.      The cardia and gastric fundus were  normal on retroflexion. Impression:        - Monilial esophagitis. Cells for cytology obtained.                    - Z-line variable. Biopsied.                    - Erosive gastritis. Biopsied.                    - Normal examined duodenum. Biopsied. Recommendation:    - Continue present medications.                    - Await pathology results.                    - Mycelex (clotrimazole) 10 mg lozenge 5x/day for 1 week. Procedure Code(s): --- Professional ---                    [phone], Esophagogastroduodenoscopy, flexible, transoral;                     with biopsy, single or multiple Diagnosis Code(s): --- Professional ---                    B37.81, Candidal esophagitis                    K22.8, Other specified diseases of esophagus                    K29.60, Other gastritis without bleeding                    K21.0, Gastro-esophageal reflux disease with esophagitis                    [id number], Eosinophilic esophagitis CPT copyright 2014 American Medical Association. All rights reserved. The codes documented in this report are preliminary and upon coder review may  be revised to meet current compliance requirements. Lollie Sails, MD 08/27/2015 10:38:02 AM This report has been signed electronically. Number of Addenda: 0 Note Initiated On: 08/27/2015 10:04 AM      [facility]

## 2015-08-28 LAB — SURGICAL PATHOLOGY

## 2015-08-29 ENCOUNTER — Encounter: Payer: Self-pay | Admitting: Gastroenterology

## 2015-08-29 LAB — MISC LABCORP TEST (SEND OUT)
LABCORP TEST CODE: 603292
Labcorp test code: 602511

## 2015-11-06 ENCOUNTER — Encounter: Payer: Managed Care, Other (non HMO) | Attending: Gastroenterology | Admitting: Dietician

## 2015-11-06 ENCOUNTER — Encounter: Payer: Self-pay | Admitting: Dietician

## 2015-11-06 VITALS — Ht 68.0 in | Wt 261.0 lb

## 2015-11-06 DIAGNOSIS — J45909 Unspecified asthma, uncomplicated: Secondary | ICD-10-CM | POA: Insufficient documentation

## 2015-11-06 DIAGNOSIS — E669 Obesity, unspecified: Secondary | ICD-10-CM

## 2015-11-06 DIAGNOSIS — Z91018 Allergy to other foods: Secondary | ICD-10-CM

## 2015-11-06 DIAGNOSIS — K219 Gastro-esophageal reflux disease without esophagitis: Secondary | ICD-10-CM | POA: Diagnosis not present

## 2015-11-06 DIAGNOSIS — L272 Dermatitis due to ingested food: Secondary | ICD-10-CM | POA: Diagnosis present

## 2015-11-06 NOTE — Patient Instructions (Addendum)
Include 2 milk products per day to help meet calcium needs. May also want to purchase orange juice with calcium added.Consider taking a calcium supplement to help meet needs.  Try Edison Pace Arthur's gluten free flour. Try GF bisquick  Strive for 5 servings of fruits/vegetables per day. Try Progresso gluten free mushroom soup. Read all labels for wheat, corn and peanut  ingredients. Use phone apps to help with gluten free foods but must double check regarding corn ingredients. Begin to prepare more dinner meals at home based on some of the suggestions given. Add more allowed grains such as Quinoa to increase whole grains. Include at least 2 servings of allowed carbohydrate per meal.

## 2015-11-06 NOTE — Progress Notes (Signed)
Medical Nutrition Therapy: Visit start time: 1310 end time: [id number] Assessment:  Diagnosis: obesity, food allergies (corn,wheat and peanuts) Past medical history: GERD, hx of esophagitis, asthma Psychosocial issues/ stress concerns: Patient rates her stress as high and indicates "ok" as to how well she is dealing with her stress. Preferred learning method:  . Auditory . Hands-on Current weight: 261 lbs  Height: 68 in Medications, supplements: see list Progress and evaluation:  Patient in for initial nutrition assessment. She is interested in a meal plan that would promote weight loss taking into consideration her recently identified food allergies of corn, wheat and peanuts. She reports that she has had no dysphagia since eliminating these 3 foods from her diet. She doesn't like milk but includes a small amount on her cereal and eats cheese which she states does not trigger a reaction. Her diet is low in calcium sources.She is reading labels for wheat, corn and peanut products and feels successful in eliminating those. Her present diet has adequate, sometimes excessive protein and some meals are lower than recommended in healthy carbohydrate recognizing this to be a challenge in eliminating wheat and corn products. She reports she initially lost 9 lbs after making diet changes several weeks ago, but that weight has stabilized in past 2 weeks. She reports her family frequently eats "out" because she doesn't want to prepare 2 separate meals for she and her family.  Her present diet is much lower in fat and higher in fruits/vegetables than diet prior to allergy diagnosis. Physical activity: very little structured exercise; occasionally walks    Dietary Intake:  Usual eating pattern includes 3 meals and 2 snacks per day. Dining out frequency: 10  meals per week.  Breakfast: Premier protein shake (30 gms protein); drinks water mixed with 2 oz. Pineapple juice and 2 oz. Apple cider vinegar before  meals Snack: fruit (either blueberries, mango or apple) Lunch: 12:00 eats in hospital cafeteria- chicken or tuna salad on a salad of lettuce, spinach, carrots, peas, bacon bits, cheese or hamburger pattie with swiss cheese and mushrooms Snack: fruit Supper: 6:00pm- [place]- chicken, cheese and rice; no chips or grilled chicken salad with [place] drsg or GF pizza/salad Snack: typically no snack after dinner Beverages: water, unsweetened tea  Nutrition Care Education: Weight control: Instructed on a meal plan based on 1800 calories to promote weight loss including carbohydrate counting and the need to balance with protein, 2-4 servings of carbohydrate per meal and non-starchy vegetables. Instructed on basic food group and servings needed to meet nutrient needs. Food allergies: Gave and reviewed list of foods/ ingredients to avoid due to either wheat or corn. Discussed meals she could prepare at home for her family and specific products that would be wheat and corn free that she could substitute in some of the meals she typically prepares.   Nutritional Diagnosis:  [place]-3.3 Overweight/obesity As related to history of high fat food choices, frequent dining out and low intake of fruits/vegetables.  As evidenced by diet history.  Intervention: Include 2 milk products per day to help meet calcium needs. May also want to purchase orange juice with calcium added.Consider taking a calcium supplement to help meet needs.  Try [employer] P[employer]'s gluten free flour. Try GF bisquick  Strive for 5 servings of fruits/vegetables per day. Try Progresso gluten free mushroom soup. Read all labels for wheat, corn and peanut  ingredients. Use phone apps to help with gluten free foods but must double check regarding corn ingredients. Begin to prepare more dinner  meals at home based on some of the suggestions given. Add more allowed grains such as Quinoa to increase whole grains. Include at least 2 servings of allowed  carbohydrate per meal.  Education Materials given:  . Food lists/ Planning A Balanced Meal . Gluten free eating for the family . Sample meal pattern/ menus . List of foods/ingredients containing corn . Goals/ instructions Learner/ who was taught:  . Patient   Level of understanding: . Partial understanding; needs review/ practice Learning barriers: . None Willingness to learn/ readiness for change: . Eager, change in progress Monitoring and Evaluation:  Dietary intake, exercise, and body weight      follow up: March 7th at 1:15pm

## 2015-12-04 ENCOUNTER — Encounter: Payer: Managed Care, Other (non HMO) | Attending: Gastroenterology | Admitting: Dietician

## 2015-12-04 ENCOUNTER — Encounter: Payer: Self-pay | Admitting: Dietician

## 2015-12-04 VITALS — Ht 68.0 in | Wt 262.4 lb

## 2015-12-04 DIAGNOSIS — E669 Obesity, unspecified: Secondary | ICD-10-CM | POA: Diagnosis not present

## 2015-12-04 DIAGNOSIS — L272 Dermatitis due to ingested food: Secondary | ICD-10-CM | POA: Diagnosis present

## 2015-12-04 DIAGNOSIS — J45909 Unspecified asthma, uncomplicated: Secondary | ICD-10-CM | POA: Diagnosis not present

## 2015-12-04 DIAGNOSIS — K219 Gastro-esophageal reflux disease without esophagitis: Secondary | ICD-10-CM | POA: Diagnosis not present

## 2015-12-04 DIAGNOSIS — Z91018 Allergy to other foods: Secondary | ICD-10-CM

## 2015-12-04 NOTE — Patient Instructions (Signed)
Focus on evening meal. [facility] Chef" meals at least 3 meals per week, 5-6 if possible. Exercise 1-2 nights per week walking 30 minutes.

## 2015-12-04 NOTE — Progress Notes (Signed)
Medical Nutrition Therapy Follow-up visit:  Time with patient: 1325 to 1355 Visit #:2 ASSESSMENT:  Diagnosis:obesity, food allergies (corn, wheat, peanuts)  Current weight:262.4 lbs    Height:68 in Medications: See list Progress and evaluation: Patient in for medical nutrition follow-up appointment. She reports that she tries to add variety to her breakfast verses always having a protein shake. She is eating more yogurt (parfait in hospital cafeteria) but has not increased her milk intake. She continues to eat fruit for morning and afternoon snack. She reads all labels for food allergens and has not had any episode of throat swelling. She and her family have continued to eat most dinner meals "out" but she has ordered "Home Chef" meals for 4 for 3 nights per week in an effort to improve meals and have better portion control. She has gained 1.4 lbs in past month  Physical activity:none  NUTRITION CARE EDUCATION: Weight control:  Discussed how focus on making healthier food choices to meet nutrient needs is more important than the scale. She agreed that breakfast and lunch meals are relatively well portioned and that dinner meal continues to be a problem area. Commended on her taking steps to find an alternative to eating "out" especially since she can specify wheat, corn and peanut free with "Home Chef". Also, discussed how exercise will increase her energy over time and make it easier to make healthy food choices. INTERVENTION:  Focus on evening meal. Cook  "Home Chef" meals at least 3 meals per week, 5-6 if possible. Exercise 1-2 nights per week walking 30 minutes.  EDUCATION MATERIALS GIVEN:  . Goals/ instructions LEARNER/ who was taught:  . Patient  LEVEL OF UNDERSTANDING: . Partial understanding; needs review/ practice LEARNING BARRIERS: . None  WILLINGNESS TO LEARN/READINESS FOR CHANGE: . Eager, change in progress  MONITORING AND EVALUATION: diet, exercise, weight  Follow-up-01/08/16 at 2:30pm

## 2016-01-08 ENCOUNTER — Ambulatory Visit: Payer: Managed Care, Other (non HMO) | Admitting: Dietician

## 2016-01-18 ENCOUNTER — Encounter: Payer: Self-pay | Admitting: Dietician

## 2016-09-03 IMAGING — MR MRI HEAD WITHOUT AND WITH CONTRAST
8 of 12 series · 31 of 48 positions shown · IV contrast (multihance)
Comparison: CT head 10/27/2013.

CLINICAL DATA: Left-sided facial numbness and left-sided hearing
loss for 1 week. No injury.

EXAM:
MRI HEAD WITHOUT AND WITH CONTRAST
TECHNIQUE: Multiplanar, multiecho pulse sequences of the brain and surrounding
structures were obtained without and with intravenous contrast.
CONTRAST:  MultiHance 20 mL.

[Series 2: T1 · sagittal · 5.0mm · 0.45mm/px · 3 of 26 slices shown (1 of 2)]
[im 1/26]
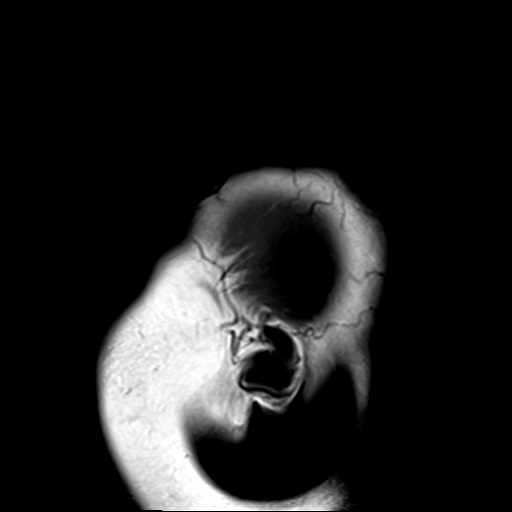
[im 13/26]
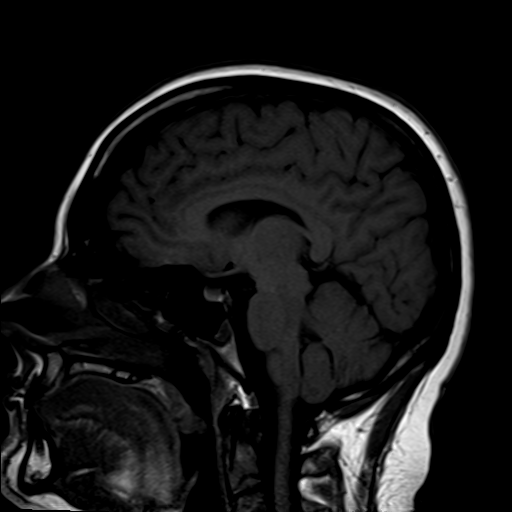
[im 26/26]
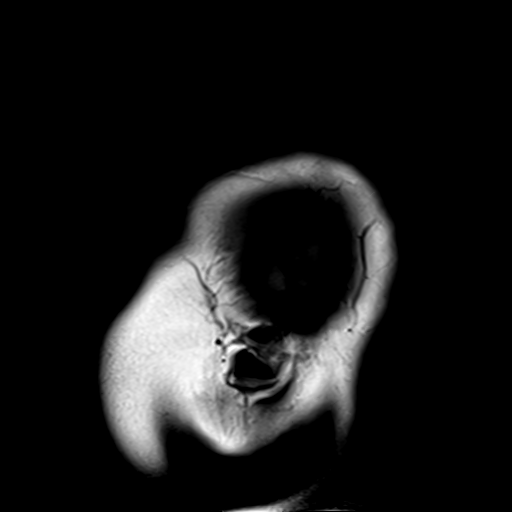

[Series 4: DWI · axial · 3.0mm · 1.80mm/px · z∈[-71,+85]mm · 5 of 42 slices shown (1 of 2)]
[im 1/42]
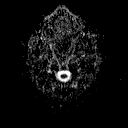
[im 11/42]
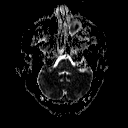
[im 21/42]
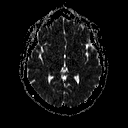
[im 31/42]
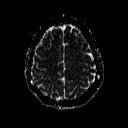
[im 42/42]
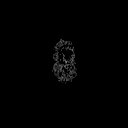

[Series 5: T2 · axial · 5.0mm · 0.60mm/px · z∈[-60,+91]mm · 3 of 25 slices shown (1 of 2)]
[im 1/25]
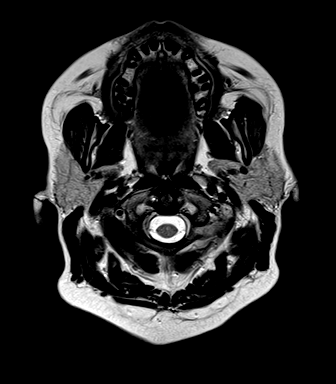
[im 13/25]
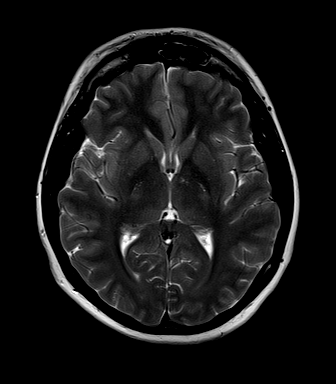
[im 25/25]
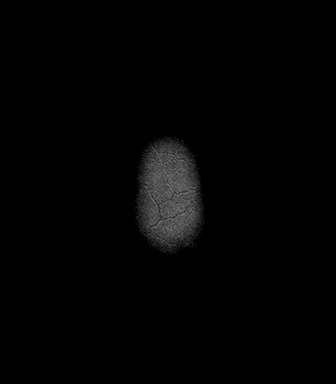

[Series 6: FLAIR · axial · 5.0mm · 0.45mm/px · z∈[-54,+85]mm · 3 of 23 slices shown (1 of 2)]
[im 1/23]
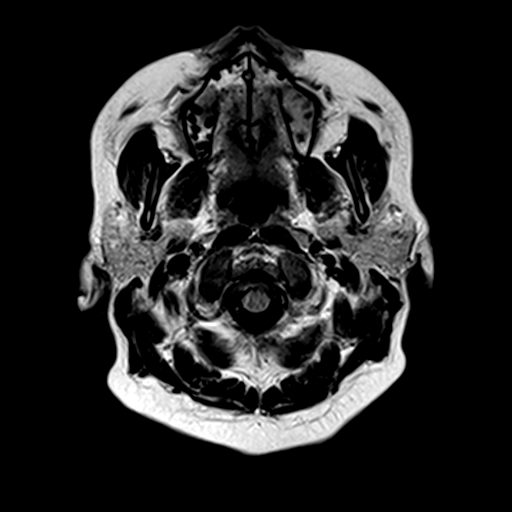
[im 12/23]
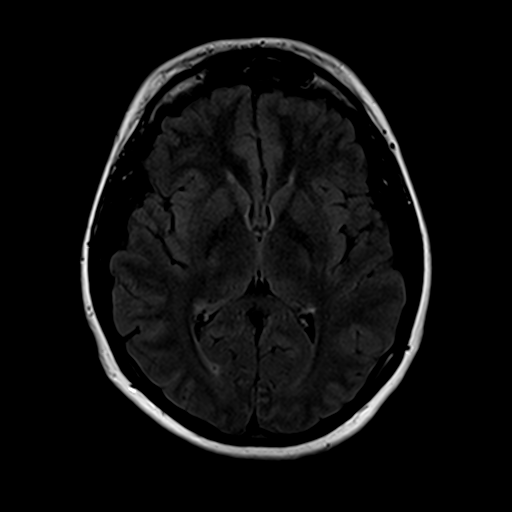
[im 23/23]
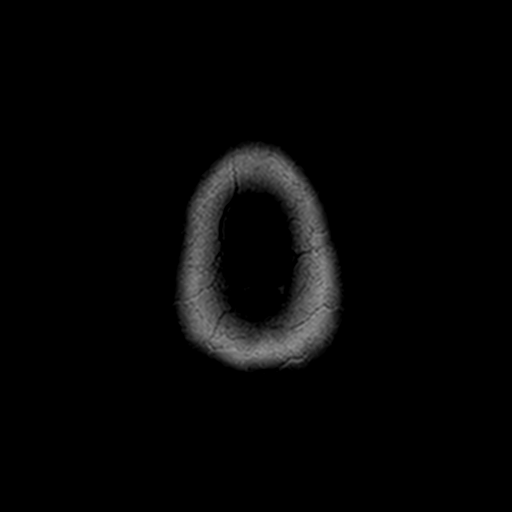

[Series 7: FLAIR · sagittal · 5.0mm · 0.43mm/px · 3 of 23 slices shown (2 of 2)]
[im 1/23]
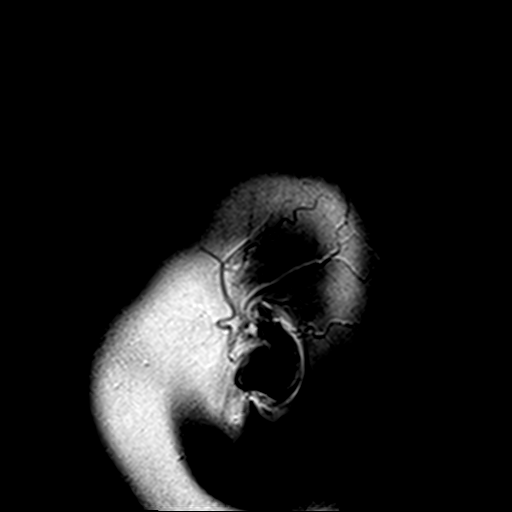
[im 12/23]
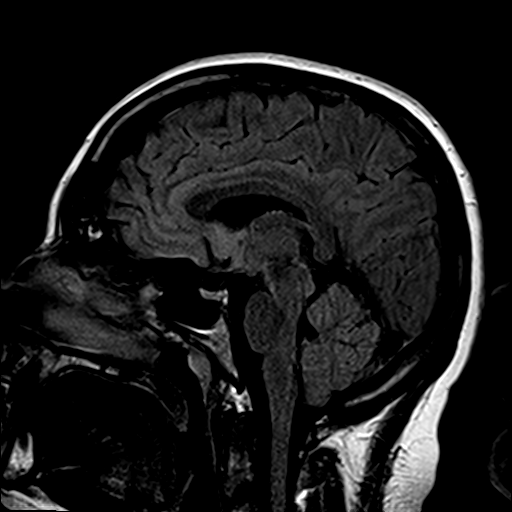
[im 23/23]
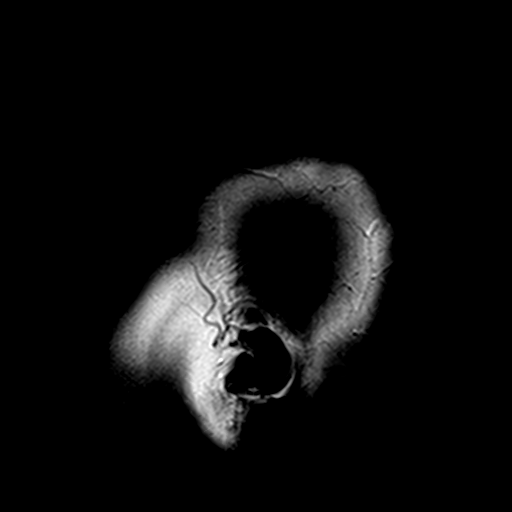

[Series 8: T1 · coronal · 3.0mm · 0.78mm/px · 2 of 11 slices shown (2 of 2)]
[im 1/11]
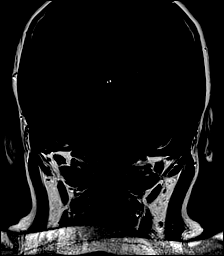
[im 11/11]
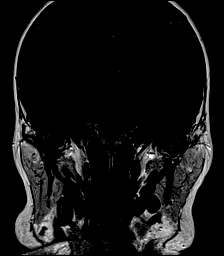

[Series 12: T2 · axial · 1.0mm · 0.35mm/px · z∈[-43,-6]mm · 6 of 40 slices shown (2 of 2)]
[im 1/40]
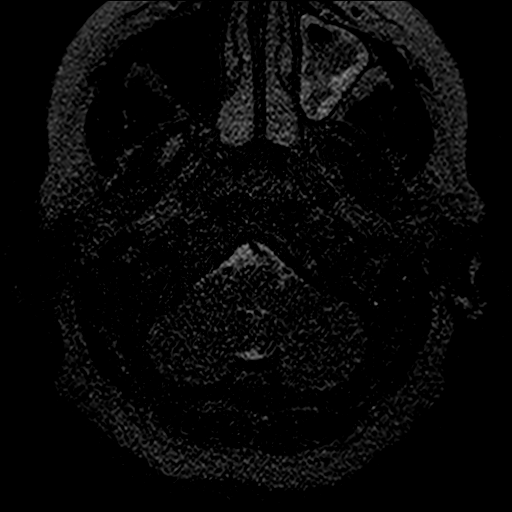
[im 8/40]
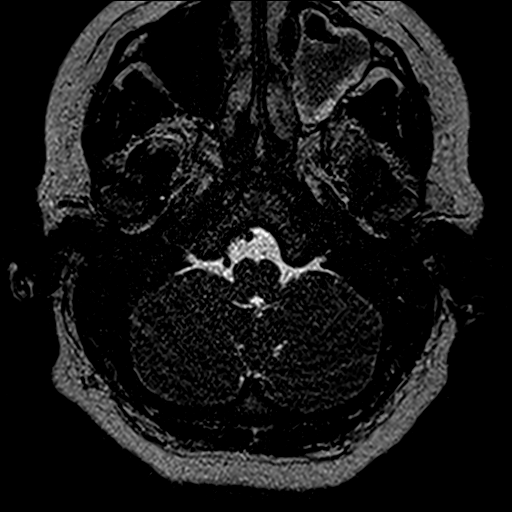
[im 16/40]
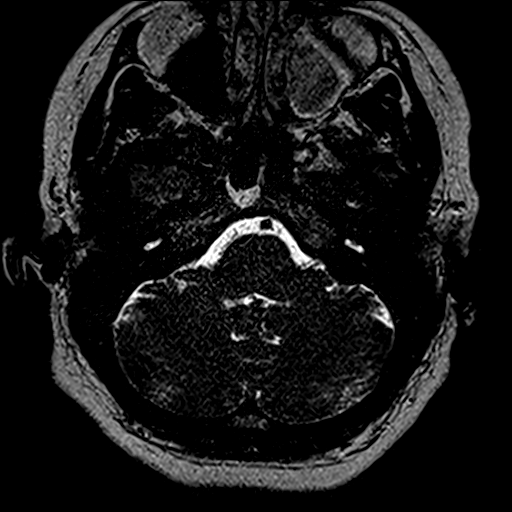
[im 24/40]
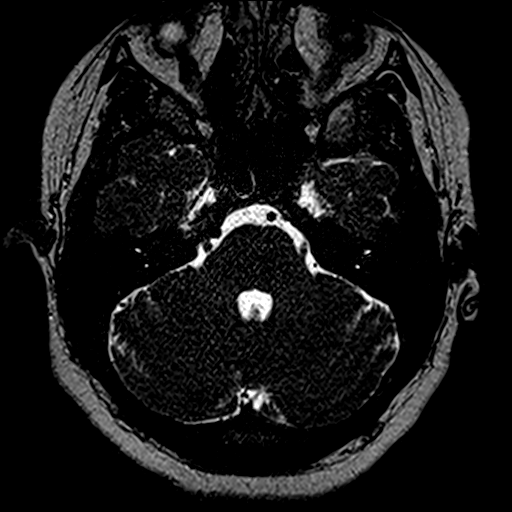
[im 32/40]
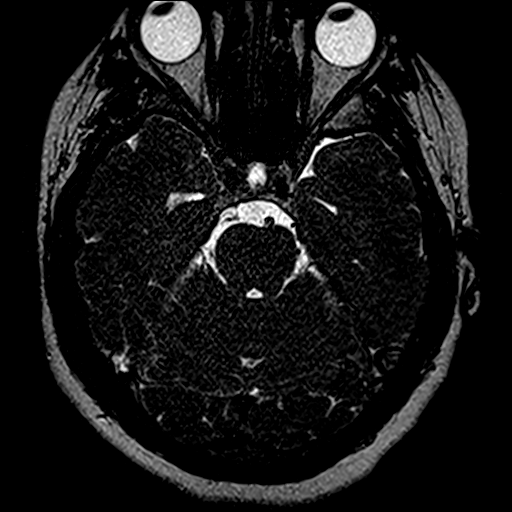
[im 40/40]
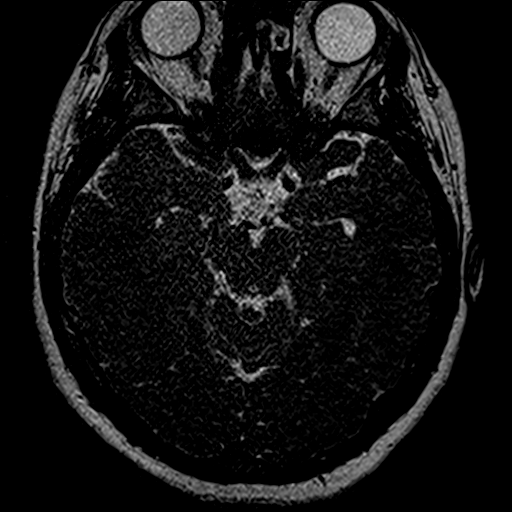

[Series 100: DWI · axial · 3.0mm · 1.80mm/px · z∈[-71,+85]mm · 6 of 42 slices shown (2 of 2)]
[im 1/42]
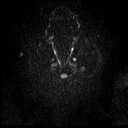
[im 9/42]
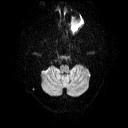
[im 17/42]
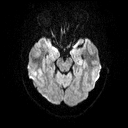
[im 25/42]
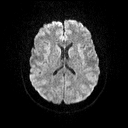
[im 33/42]
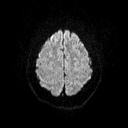
[im 42/42]
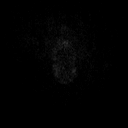

[31 of 48 positions shown; findings below may reference images not displayed]

FINDINGS: No evidence for acute infarction, hemorrhage, mass lesion,
hydrocephalus, or extra-axial fluid. Normal cerebral volume. No
white matter disease. No midline abnormality. Flow voids are
preserved.

Thin-section imaging through the posterior fossa reveals no evidence
for vestibular schwannoma or posterior fossa mass. No temporal bone
inflammatory process. Major dural venous sinuses are patent. Post
infusion imaging through the whole head demonstrates no abnormal
enhancement of the brain or meninges. Mild asymmetry of the
Gasserian ganglia, slightly larger on the LEFT, felt to be
nonpathologic given lack of enhancement, as well as the normal
appearance of the neural striations on both sides.

In the LEFT maxillary sinus there is significant mucosal thickening
with foamy secretions and air-fluid level suggesting acute on
chronic sinus disease. Minor anterior ethmoid sinus fluid. Slight
LEFT mastoid fluid without nasopharyngeal disease. Slight RIGHT
sphenoid air-fluid level. Minor mucosal thickening surrounding LEFT
frontal sinus. Scattered cervical lymph nodes are incompletely
evaluated.
IMPRESSION: No acute or focal intracranial abnormality. No evidence for white
matter disease. No abnormal postcontrast enhancement.

Chronic and acute sinusitis, most notable in the LEFT maxillary
sinus.

Mild LEFT mastoid effusion, without nasopharyngeal disease, middle
ear pathology, or inner ear enhancement.

## 2018-02-05 ENCOUNTER — Ambulatory Visit
Admission: RE | Admit: 2018-02-05 | Discharge: 2018-02-05 | Disposition: A | Discharge status: Home / Self Care | Payer: 59 | Source: Ambulatory Visit | Attending: Physician Assistant | Admitting: Physician Assistant

## 2018-02-05 ENCOUNTER — Other Ambulatory Visit: Payer: Self-pay | Admitting: Physician Assistant

## 2018-02-05 DIAGNOSIS — R1031 Right lower quadrant pain: Secondary | ICD-10-CM

## 2018-02-05 DIAGNOSIS — R1084 Generalized abdominal pain: Secondary | ICD-10-CM

## 2018-02-05 DIAGNOSIS — N83202 Unspecified ovarian cyst, left side: Secondary | ICD-10-CM | POA: Insufficient documentation

## 2018-02-05 DIAGNOSIS — Z975 Presence of (intrauterine) contraceptive device: Secondary | ICD-10-CM | POA: Insufficient documentation

## 2018-02-05 DIAGNOSIS — R140 Abdominal distension (gaseous): Secondary | ICD-10-CM

## 2018-02-15 LAB — OB RESULTS CONSOLE HIV ANTIBODY (ROUTINE TESTING): HIV: NONREACTIVE

## 2018-08-09 LAB — OB RESULTS CONSOLE GC/CHLAMYDIA
Chlamydia: NEGATIVE
Gonorrhea: NEGATIVE

## 2018-08-10 ENCOUNTER — Other Ambulatory Visit: Payer: Self-pay | Admitting: Obstetrics and Gynecology

## 2018-08-10 DIAGNOSIS — Z369 Encounter for antenatal screening, unspecified: Secondary | ICD-10-CM

## 2018-08-10 LAB — OB RESULTS CONSOLE RUBELLA ANTIBODY, IGM: Rubella: IMMUNE

## 2018-08-10 LAB — OB RESULTS CONSOLE RPR: RPR: NONREACTIVE

## 2018-08-10 LAB — OB RESULTS CONSOLE VARICELLA ZOSTER ANTIBODY, IGG: Varicella: IMMUNE

## 2018-08-10 LAB — OB RESULTS CONSOLE HEPATITIS B SURFACE ANTIGEN: Hepatitis B Surface Ag: NEGATIVE

## 2018-08-30 ENCOUNTER — Other Ambulatory Visit: Payer: Self-pay | Admitting: Obstetrics and Gynecology

## 2018-08-30 ENCOUNTER — Ambulatory Visit (HOSPITAL_BASED_OUTPATIENT_CLINIC_OR_DEPARTMENT_OTHER)
Admission: RE | Admit: 2018-08-30 | Discharge: 2018-08-30 | Disposition: A | Discharge status: Home / Self Care | Payer: Managed Care, Other (non HMO) | Source: Ambulatory Visit | Attending: Obstetrics and Gynecology | Admitting: Obstetrics and Gynecology

## 2018-08-30 ENCOUNTER — Ambulatory Visit
Admission: RE | Admit: 2018-08-30 | Discharge: 2018-08-30 | Disposition: A | Discharge status: Home / Self Care | Payer: Managed Care, Other (non HMO) | Source: Ambulatory Visit | Attending: Obstetrics and Gynecology | Admitting: Obstetrics and Gynecology

## 2018-08-30 VITALS — BP 130/80 | HR 92 | Temp 98.4°F | Resp 18 | Ht 68.0 in | Wt 260.6 lb

## 2018-08-30 DIAGNOSIS — Z315 Encounter for genetic counseling: Secondary | ICD-10-CM | POA: Insufficient documentation

## 2018-08-30 DIAGNOSIS — Z3A12 12 weeks gestation of pregnancy: Secondary | ICD-10-CM | POA: Insufficient documentation

## 2018-08-30 DIAGNOSIS — Z369 Encounter for antenatal screening, unspecified: Secondary | ICD-10-CM

## 2018-08-30 DIAGNOSIS — O99211 Obesity complicating pregnancy, first trimester: Secondary | ICD-10-CM | POA: Insufficient documentation

## 2018-08-30 DIAGNOSIS — O3481 Maternal care for other abnormalities of pelvic organs, first trimester: Secondary | ICD-10-CM | POA: Diagnosis not present

## 2018-08-30 DIAGNOSIS — Z833 Family history of diabetes mellitus: Secondary | ICD-10-CM | POA: Insufficient documentation

## 2018-08-30 DIAGNOSIS — N83202 Unspecified ovarian cyst, left side: Secondary | ICD-10-CM | POA: Insufficient documentation

## 2018-08-30 DIAGNOSIS — Z3689 Encounter for other specified antenatal screening: Secondary | ICD-10-CM | POA: Insufficient documentation

## 2018-08-30 DIAGNOSIS — E669 Obesity, unspecified: Secondary | ICD-10-CM | POA: Insufficient documentation

## 2018-08-30 NOTE — Progress Notes (Addendum)
Paula Padilla K Length of Consultation: 30 minutes   Paula Padilla  was referred to [facility] for genetic counseling to review prenatal screening and testing options.  This note summarizes the information we discussed.    We offered the following routine screening tests for this pregnancy:  First trimester screening, which includes nuchal translucency ultrasound screen and first trimester maternal serum marker screening.  The nuchal translucency has approximately an 80% detection rate for Down syndrome and can be positive for other chromosome abnormalities as well as congenital heart defects.  When combined with a maternal serum marker screening, the detection rate is up to 90% for Down syndrome and up to 97% for trisomy 18.     Maternal serum marker screening, a blood test that measures pregnancy proteins, can provide risk assessments for Down syndrome, trisomy 18, and open neural tube defects (spina bifida, anencephaly). Because it does not directly examine the fetus, it cannot positively diagnose or rule out these problems.  Targeted ultrasound uses high frequency sound waves to create an image of the developing fetus.  An ultrasound is often recommended as a routine means of evaluating the pregnancy.  It is also used to screen for fetal anatomy problems (for example, a heart defect) that might be suggestive of a chromosomal or other abnormality.   Should these screening tests indicate an increased concern, then the following additional testing options would be offered:  The chorionic villus sampling procedure is available for first trimester chromosome analysis.  This involves the withdrawal of a small amount of chorionic villi (tissue from the developing placenta).  Risk of pregnancy loss is estimated to be approximately 1 in 200 to 1 in 100 (0.5 to 1%).  There is approximately a 1% (1 in 100) chance that the CVS chromosome results will be unclear.  Chorionic  villi cannot be tested for neural tube defects.     Amniocentesis involves the removal of a small amount of amniotic fluid from the sac surrounding the fetus with the use of a thin needle inserted through the maternal abdomen and uterus.  Ultrasound guidance is used throughout the procedure.  Fetal cells from amniotic fluid are directly evaluated and > 99.5% of chromosome problems and > 98% of open neural tube defects can be detected. This procedure is generally performed after the 15th week of pregnancy.  The main risks to this procedure include complications leading to miscarriage in less than 1 in 200 cases (0.5%).  As another option for information if the pregnancy is suspected to be an an increased chance for certain chromosome conditions, we also reviewed the availability of cell free fetal DNA testing from maternal blood to determine whether or not the baby may have either Down syndrome, trisomy 52, or trisomy 75.  This test utilizes a maternal blood sample and DNA sequencing technology to isolate circulating cell free fetal DNA from maternal plasma.  The fetal DNA can then be analyzed for DNA sequences that are derived from the three most common chromosomes involved in aneuploidy, chromosomes 13, 18, and 21.  If the overall amount of DNA is greater than the expected level for any of these chromosomes, aneuploidy is suspected.  While we do not consider it a replacement for invasive testing and karyotype analysis, a negative result from this testing would be reassuring, though not a guarantee of a normal chromosome complement for the baby.  An abnormal result is certainly suggestive of an abnormal chromosome complement, though we would still  recommend CVS or amniocentesis to confirm any findings from this testing.  Cystic Fibrosis and Spinal Muscular Atrophy (SMA) screening were also discussed with the patient. Both conditions are recessive, which means that both parents must be carriers in order to have  a child with the disease.  Cystic fibrosis (CF) is one of the most common genetic conditions in persons of Caucasian ancestry.  This condition occurs in approximately 1 in 2,500 Caucasian persons and results in thickened secretions in the lungs, digestive, and reproductive systems.  For a baby to be at risk for having CF, both of the parents must be carriers for this condition.  Approximately 1 in 50 Caucasian persons is a carrier for CF.  Current carrier testing looks for the most common mutations in the gene for CF and can detect approximately 90% of carriers in the Caucasian population.  This means that the carrier screening can greatly reduce, but cannot eliminate, the chance for an individual to have a child with CF.  If an individual is found to be a carrier for CF, then carrier testing would be available for the partner. As part of [place] newborn screening profile, all babies born in the state of [place] will have a two-tier screening process.  Specimens are first tested to determine the concentration of immunoreactive trypsinogen (IRT).  The top 5% of specimens with the highest IRT values then undergo DNA testing using a panel of over 40 common CF mutations. SMA is a neurodegenerative disorder that leads to atrophy of skeletal muscle and overall weakness.  This condition is also more prevalent in the Caucasian population, with 1 in 40-1 in 60 persons being a carrier and 1 in 6,000-1 in 10,000 children being affected.  There are multiple forms of the disease, with some causing death in infancy to other forms with survival into adulthood.  The genetics of SMA is complex, but carrier screening can detect up to 95% of carriers in the Caucasian population.  Similar to CF, a negative result can greatly reduce, but cannot eliminate, the chance to have a child with SMA.  We obtained a detailed family history and pregnancy history.  Paula Padilla and his mother both have type 2 diabetes, which we reviewed  is thought to have both inherited as well as lifestyle factors.  Paula Padilla also reported one maternal first cousin once removed with congenital hearing loss.  She knows no details about this cousin and is not aware of any other health or developmental differences.  We discussed that approximately half of congenital deafness is due to genetic causes, with most of those being recessive.  Without a known genetic cause, an accurate risk assessment is difficult but given the distance of the relative to this pregnancy and the fact that the father of the baby has no family history of deafness, the chance for a similar condition in this pregnancy is expected to be very small. The remainder of the family history was reported to be unremarkable for birth defects, intellectual delays, recurrent pregnancy loss or known chromosome abnormalities.  Paula Padilla stated that this is her third pregnancy, the first with this partner Paula Padilla). She has two healthy sons, ages 50 years and 5 years.  She reported no complications in this pregnancy other than hyperemesis and a small amount of brown spotting, which her OB told her was likely related to a friable cervix. She also stated that she is taking medications for nausea and allergies/asthma as prescribed by her OB, none of  which is known to increase the risk for birth defects.  After consideration of the options, Paula Padilla elected to proceed with first trimester screening.  She declined CF and SMA carrier testing.  An ultrasound was performed at the time of the visit.  The gestational age was consistent with 12 weeks, 3 days.  Fetal anatomy could not be assessed due to early gestational age.  Please refer to the ultrasound report for details of that study.  Paula Padilla was encouraged to call with questions or concerns.  We can be contacted at [phone].  Labs ordered: first trimester screening  Wilburt Finlay, MS, CGC  I reviewed the counselign session and the  ultrasound and met with the patient . I concur with the summary. Gatha Mayer

## 2018-09-06 ENCOUNTER — Telehealth: Payer: Self-pay | Admitting: Obstetrics and Gynecology

## 2018-09-06 NOTE — Telephone Encounter (Signed)
  Paula Padilla elected to undergo First Trimester screening on 08/30/2018.  To review, first trimester screening, includes nuchal translucency ultrasound screen and/or first trimester maternal serum marker screening.  The nuchal translucency has approximately an 80% detection rate for Down syndrome and can be positive for other chromosome abnormalities as well as heart defects.  When combined with a maternal serum marker screening, the detection rate is up to 90% for Down syndrome and up to 97% for trisomy 13 and 18.     The results of the First Trimester Nuchal Translucency and Biochemical Screening were within normal range.  The risk for Down syndrome is now estimated to be less than 1 in 10,000.  The risk for Trisomy 13/18 is also estimated to be less than 1 in 10,000.  Should more definitive information be desired, we would offer amniocentesis.  Because we do not yet know the effectiveness of combined first and second trimester screening, we do not recommend a maternal serum screen to assess the chance for chromosome conditions.  However, if screening for neural tube defects is desired, maternal serum screening for AFP only can be performed between 15 and [redacted] weeks gestation.      Wilburt Finlay, MS, CGC

## 2018-10-06 ENCOUNTER — Other Ambulatory Visit: Payer: Self-pay | Admitting: Family Medicine

## 2018-10-06 DIAGNOSIS — M5412 Radiculopathy, cervical region: Secondary | ICD-10-CM

## 2018-11-01 ENCOUNTER — Ambulatory Visit
Admission: RE | Admit: 2018-11-01 | Discharge: 2018-11-01 | Disposition: A | Discharge status: Home / Self Care | Payer: Managed Care, Other (non HMO) | Source: Ambulatory Visit | Attending: Family Medicine | Admitting: Family Medicine

## 2018-11-01 DIAGNOSIS — M5412 Radiculopathy, cervical region: Secondary | ICD-10-CM | POA: Insufficient documentation

## 2018-11-05 ENCOUNTER — Ambulatory Visit: Payer: 59

## 2018-11-07 ENCOUNTER — Ambulatory Visit: Payer: 59

## 2019-01-18 ENCOUNTER — Other Ambulatory Visit: Payer: Self-pay

## 2019-01-18 ENCOUNTER — Observation Stay
Admission: RE | Admit: 2019-01-18 | Discharge: 2019-01-18 | Disposition: A | Discharge status: Home / Self Care | Payer: Managed Care, Other (non HMO) | Attending: Obstetrics and Gynecology | Admitting: Obstetrics and Gynecology

## 2019-01-18 DIAGNOSIS — O26893 Other specified pregnancy related conditions, third trimester: Principal | ICD-10-CM | POA: Insufficient documentation

## 2019-01-18 DIAGNOSIS — J45909 Unspecified asthma, uncomplicated: Secondary | ICD-10-CM | POA: Diagnosis not present

## 2019-01-18 DIAGNOSIS — O99613 Diseases of the digestive system complicating pregnancy, third trimester: Secondary | ICD-10-CM | POA: Diagnosis not present

## 2019-01-18 DIAGNOSIS — O99513 Diseases of the respiratory system complicating pregnancy, third trimester: Secondary | ICD-10-CM | POA: Diagnosis not present

## 2019-01-18 DIAGNOSIS — R030 Elevated blood-pressure reading, without diagnosis of hypertension: Secondary | ICD-10-CM | POA: Diagnosis present

## 2019-01-18 DIAGNOSIS — O133 Gestational [pregnancy-induced] hypertension without significant proteinuria, third trimester: Secondary | ICD-10-CM

## 2019-01-18 DIAGNOSIS — K219 Gastro-esophageal reflux disease without esophagitis: Secondary | ICD-10-CM | POA: Diagnosis not present

## 2019-01-18 DIAGNOSIS — O212 Late vomiting of pregnancy: Secondary | ICD-10-CM | POA: Insufficient documentation

## 2019-01-18 DIAGNOSIS — Z8249 Family history of ischemic heart disease and other diseases of the circulatory system: Secondary | ICD-10-CM | POA: Diagnosis not present

## 2019-01-18 DIAGNOSIS — Z7982 Long term (current) use of aspirin: Secondary | ICD-10-CM | POA: Diagnosis not present

## 2019-01-18 DIAGNOSIS — Z79899 Other long term (current) drug therapy: Secondary | ICD-10-CM | POA: Insufficient documentation

## 2019-01-18 DIAGNOSIS — Z3A32 32 weeks gestation of pregnancy: Secondary | ICD-10-CM | POA: Insufficient documentation

## 2019-01-18 LAB — COMPREHENSIVE METABOLIC PANEL
ALT: 10 U/L (ref 0–44)
AST: 12 U/L — ABNORMAL LOW (ref 15–41)
Albumin: 3.2 g/dL — ABNORMAL LOW (ref 3.5–5.0)
Alkaline Phosphatase: 68 U/L (ref 38–126)
Anion gap: 8 (ref 5–15)
BUN: <5 mg/dL — ABNORMAL LOW (ref 6–20)
CO2: 21 mmol/L — ABNORMAL LOW (ref 22–32)
Calcium: 8.8 mg/dL — ABNORMAL LOW (ref 8.9–10.3)
Chloride: 109 mmol/L (ref 98–111)
Creatinine, Ser: 0.53 mg/dL (ref 0.44–1.00)
GFR calc Af Amer: >60 mL/min (ref >60)
GFR calc non Af Amer: >60 mL/min (ref >60)
Glucose, Bld: 88 mg/dL (ref 70–99)
Potassium: 3.4 mmol/L — ABNORMAL LOW (ref 3.5–5.1)
Sodium: 138 mmol/L (ref 135–145)
Total Bilirubin: 0.5 mg/dL (ref 0.3–1.2)
Total Protein: 6.7 g/dL (ref 6.5–8.1)

## 2019-01-18 LAB — CBC WITH DIFFERENTIAL/PLATELET
Abs Immature Granulocytes: 0.03 10*3/uL (ref 0.00–0.07)
Basophils Absolute: 0 10*3/uL (ref 0.0–0.1)
Basophils Relative: 0 %
Eosinophils Absolute: 0.2 10*3/uL (ref 0.0–0.5)
Eosinophils Relative: 2 %
HCT: 34.9 % — ABNORMAL LOW (ref 36.0–46.0)
Hemoglobin: 11.9 g/dL — ABNORMAL LOW (ref 12.0–15.0)
Immature Granulocytes: 0 %
Lymphocytes Relative: 23 %
Lymphs Abs: 1.8 10*3/uL (ref 0.7–4.0)
MCH: 32.4 pg (ref 26.0–34.0)
MCHC: 34.1 g/dL (ref 30.0–36.0)
MCV: 95.1 fL (ref 80.0–100.0)
Monocytes Absolute: 0.5 10*3/uL (ref 0.1–1.0)
Monocytes Relative: 6 %
Neutro Abs: 5.5 10*3/uL (ref 1.7–7.7)
Neutrophils Relative %: 69 %
Platelets: 240 10*3/uL (ref 150–400)
RBC: 3.67 MIL/uL — ABNORMAL LOW (ref 3.87–5.11)
RDW: 12.7 % (ref 11.5–15.5)
WBC: 8.1 10*3/uL (ref 4.0–10.5)
nRBC: 0 % (ref 0.0–0.2)

## 2019-01-18 LAB — PROTEIN / CREATININE RATIO, URINE
Creatinine, Urine: 177 mg/dL
Protein Creatinine Ratio: 0.05 mg/mg{Cre} (ref 0.00–0.15)
Total Protein, Urine: 8 mg/dL

## 2019-01-18 NOTE — Discharge Instructions (Signed)
Hypertension During Pregnancy ° °Hypertension, commonly called high blood pressure, is when the force of blood pumping through your arteries is too strong. Arteries are blood vessels that carry blood from the heart throughout the body. Hypertension during pregnancy can cause problems for you and your baby. Your baby may be born early (prematurely) or may not weigh as much as he or she should at birth. Very bad cases of hypertension during pregnancy can be life-threatening. °Different types of hypertension can occur during pregnancy. These include: °· Chronic hypertension. This happens when: °? You have hypertension before pregnancy and it continues during pregnancy. °? You develop hypertension before you are [redacted] weeks pregnant, and it continues during pregnancy. °· Gestational hypertension. This is hypertension that develops after the 20th week of pregnancy. °· Preeclampsia, also called toxemia of pregnancy. This is a very serious type of hypertension that develops during pregnancy. It can be very dangerous for you and your baby. °? In rare cases, you may develop preeclampsia after giving birth (postpartum preeclampsia). This usually occurs within 48 hours after childbirth but may occur up to 6 weeks after giving birth. °Gestational hypertension and preeclampsia usually go away within 6 weeks after your baby is born. Women who have hypertension during pregnancy have a greater chance of developing hypertension later in life or during future pregnancies. °What are the causes? °The exact cause of hypertension during pregnancy is not known. °What increases the risk? °There are certain factors that make it more likely for you to develop hypertension during pregnancy. These include: °· Having hypertension during a previous pregnancy or prior to pregnancy. °· Being overweight. °· Being age 35 or older. °· Being pregnant for the first time. °· Being pregnant with more than one baby. °· Becoming pregnant using fertilization  methods such as IVF (in vitro fertilization). °· Having diabetes, kidney problems, or systemic lupus erythematosus. °· Having a family history of hypertension. °What are the signs or symptoms? °Chronic hypertension and gestational hypertension rarely cause symptoms. Preeclampsia causes symptoms, which may include: °· Increased protein in your urine. Your health care provider will check for this at every visit before you give birth (prenatal visit). °· Severe headaches. °· Sudden weight gain. °· Swelling of the hands, face, legs, and feet. °· Nausea and vomiting. °· Vision problems, such as blurred or double vision. °· Numbness in the face, arms, legs, and feet. °· Dizziness. °· Slurred speech. °· Sensitivity to bright lights. °· Abdominal pain. °· Convulsions or seizures. °How is this diagnosed? °You may be diagnosed with hypertension during a routine prenatal exam. At each prenatal visit, you may: °· Have a urine test to check for high amounts of protein in your urine. °· Have your blood pressure checked. A blood pressure reading is given as two numbers, such as "120 over 80" (or 120/80). The first ("top") number is a measure of the pressure in your arteries when your heart beats (systolic pressure). The second ("bottom") number is a measure of the pressure in your arteries as your heart relaxes between beats (diastolic pressure). Blood pressure is measured in a unit called mm Hg. For most women, a normal blood pressure reading is: °? Systolic: below 120. °? Diastolic: below 80. °The type of hypertension that you are diagnosed with depends on your test results and when your symptoms developed. °· Chronic hypertension is usually diagnosed before 20 weeks of pregnancy. °· Gestational hypertension is usually diagnosed after 20 weeks of pregnancy. °· Hypertension with high amounts of protein in   the urine is diagnosed as preeclampsia. °· Blood pressure measurements that stay above 160 systolic, or above 110 diastolic,  are signs of severe preeclampsia. °How is this treated? °Treatment for hypertension during pregnancy varies depending on the type of hypertension you have and how serious it is. °· If you take medicines called ACE inhibitors to treat chronic hypertension, you may need to switch medicines. ACE inhibitors should not be taken during pregnancy. °· If you have gestational hypertension, you may need to take blood pressure medicine. °· If you are at risk for preeclampsia, your health care provider may recommend that you take a low-dose aspirin during your pregnancy. °· If you have severe preeclampsia, you may need to be hospitalized so you and your baby can be monitored closely. You may also need to take medicine (magnesium sulfate) to prevent seizures and to lower blood pressure. This medicine may be given as an injection or through an IV. °· In some cases, if your condition gets worse, you may need to deliver your baby early. °Follow these instructions at home: °Eating and drinking ° °· Drink enough fluid to keep your urine pale yellow. °· Avoid caffeine. °Lifestyle °· Do not use any products that contain nicotine or tobacco, such as cigarettes and e-cigarettes. If you need help quitting, ask your health care provider. °· Do not use alcohol or drugs. °· Avoid stress as much as possible. Rest and get plenty of sleep. °General instructions °· Take over-the-counter and prescription medicines only as told by your health care provider. °· While lying down, lie on your left side. This keeps pressure off your major blood vessels. °· While sitting or lying down, raise (elevate) your feet. Try putting some pillows under your lower legs. °· Exercise regularly. Ask your health care provider what kinds of exercise are best for you. °· Keep all prenatal and follow-up visits as told by your health care provider. This is important. °Contact a health care provider if: °· You have symptoms that your health care provider told you may  require more treatment or monitoring, such as: °? Nausea or vomiting. °? Headache. °Get help right away if you have: °· Severe abdominal pain that does not get better with treatment. °· A severe headache that does not get better. °· Vomiting that does not get better. °· Sudden, rapid weight gain. °· Sudden swelling in your hands, ankles, or face. °· Vaginal bleeding. °· Blood in your urine. °· Fewer movements from your baby than usual. °· Blurred or double vision. °· Muscle twitching or sudden muscle tightening (spasms). °· Shortness of breath. °· Blue fingernails or lips. °Summary °· Hypertension, commonly called high blood pressure, is when the force of blood pumping through your arteries is too strong. °· Hypertension during pregnancy can cause problems for you and your baby. °· Treatment for hypertension during pregnancy varies depending on the type of hypertension you have and how serious it is. °· Get help right away if you have symptoms that your health care provider told you to watch for. °This information is not intended to replace advice given to you by your health care provider. Make sure you discuss any questions you have with your health care provider. °Document Released: 06/03/2011 Document Revised: 09/01/2017 Document Reviewed: 02/29/2016 °Elsevier Interactive Patient Education © 2019 [employer] ° °

## 2019-01-18 NOTE — Discharge Summary (Signed)
Paula Padilla is a 33 y.o. female. She is at [redacted]w[redacted]d gestation. Patient's last menstrual period was 06/04/2018. Estimated Date of Delivery: 03/11/19  Prenatal care site: [facility]   Current pregnancy complicated by:  1. Hx Pre-eclampsia with both previous pregnancies- on baby ASA, baseline labs WNL, Increased BP at 32wks 2. Obesity, BMI 40.4 3. hx Thyroid nodules- normal thyroid labs, followed by Endo, Dr Gabriel Carina.  4. Hx Hyperemesis 5. Bulging cervical discs- TENS unit, comfort measures, Flexeril and Norco prn.  Chief complaint: elevated BP in office.   Location: visual floaters Onset/timing: last 2 days off and on Duration: n/a Quality: n/a Severity: n/a Aggravating or alleviating conditions: more floaters while at work Associated signs/symptoms:  No RUQ pain, or HA.  Context: hx Pre-e x 2.   S: Resting comfortably. no CTX, no VB.no LOF,  Active fetal movement.  Denies: HA, SOB, or RUQ/epigastric pain  Maternal Medical History:   Past Medical History:  Diagnosis Date  . Asthma   . Asthma without status asthmaticus    uses inhaler every spring 3-4x/week  . Esophageal motility disorder   . Fundic gland polyps of stomach, benign   . Gastritis   . GERD (gastroesophageal reflux disease)   . Hyperemesis   . Monilial esophagitis ([place])     Past Surgical History:  Procedure Laterality Date  . ESOPHAGOGASTRODUODENOSCOPY (EGD) WITH PROPOFOL N/A 06/26/2015   Procedure: ESOPHAGOGASTRODUODENOSCOPY (EGD) WITH PROPOFOL;  Surgeon: Lollie Sails, MD;  Location[facility] ENDOSCOPY;  Service: Endoscopy;  Laterality: N/A;  . ESOPHAGOGASTRODUODENOSCOPY (EGD) WITH PROPOFOL N/A 08/27/2015   Procedure: ESOPHAGOGASTRODUODENOSCOPY (EGD) WITH PROPOFOL;  Surgeon: Lollie Sails, MD;  Location[facility] ENDOSCOPY;  Service: Endoscopy;  Laterality: N/A;  . WISDOM TOOTH EXTRACTION      Allergies  Allergen Reactions  . Peanut Oil Swelling    Throat swelling  . Corn Oil Itching and Rash     Throat swelling  . Wheat Bran Itching and Rash    Throat swelling    Prior to Admission medications   Medication Sig Start Date End Date Taking? Authorizing Provider  aspirin 81 MG chewable tablet Chew by mouth daily.   Yes [provider]  Prenatal Vit-Fe Fumarate-FA (PRENATAL MULTIVITAMIN) TABS tablet Take 1 tablet by mouth daily at 12 noon.   Yes [provider]  albuterol (PROVENTIL HFA;VENTOLIN HFA) 108 (90 BASE) MCG/ACT inhaler Inhale 2 puffs into the lungs every 6 (six) hours as needed for wheezing or shortness of breath.    [provider]  Azelastine-Fluticasone 137-50 MCG/ACT SUSP Place 2 sprays into the nose daily.    [provider]  metoCLOPramide (REGLAN) 10 MG tablet Take 10 mg by mouth every 6 (six) hours as needed for nausea.    [provider]  pantoprazole (PROTONIX) 40 MG tablet Take 40 mg by mouth 2 (two) times daily.    [provider]  promethazine (PHENERGAN) 25 MG suppository Place 25 mg rectally every 6 (six) hours as needed for nausea or vomiting.    [provider]      Social History: She  reports that she has never smoked. She has never used smokeless tobacco. She reports that she does not drink alcohol or use drugs.  Family History: family history includes Alcohol abuse in her father; Asthma in her son and son; Cirrhosis in her father; Heart attack in her father; Heart disease in her father.   Review of Systems: A full review of systems was performed and  negative except as noted in the HPI.     O:  BP (!) 97/53 (BP Location: Right Arm)   Pulse 81   LMP 06/04/2018  Results for orders placed or performed during the hospital encounter of 01/18/19 (from the past 48 hour(s))  Protein / creatinine ratio, urine   Collection Time: 01/18/19 11:01 AM  Result Value Ref Range   Creatinine, Urine 177 mg/dL   Total Protein, Urine 8 mg/dL   Protein Creatinine Ratio 0.05 0.00 - 0.15 mg/mg[Cre]  CBC  with Differential/Platelet   Collection Time: 01/18/19 11:08 AM  Result Value Ref Range   WBC 8.1 4.0 - 10.5 K/uL   RBC 3.67 (L) 3.87 - 5.11 MIL/uL   Hemoglobin 11.9 (L) 12.0 - 15.0 g/dL   HCT 34.9 (L) 36.0 - 46.0 %   MCV 95.1 80.0 - 100.0 fL   MCH 32.4 26.0 - 34.0 pg   MCHC 34.1 30.0 - 36.0 g/dL   RDW 12.7 11.5 - 15.5 %   Platelets 240 150 - 400 K/uL   nRBC 0.0 0.0 - 0.2 %   Neutrophils Relative % 69 %   Neutro Abs 5.5 1.7 - 7.7 K/uL   Lymphocytes Relative 23 %   Lymphs Abs 1.8 0.7 - 4.0 K/uL   Monocytes Relative 6 %   Monocytes Absolute 0.5 0.1 - 1.0 K/uL   Eosinophils Relative 2 %   Eosinophils Absolute 0.2 0.0 - 0.5 K/uL   Basophils Relative 0 %   Basophils Absolute 0.0 0.0 - 0.1 K/uL   Immature Granulocytes 0 %   Abs Immature Granulocytes 0.03 0.00 - 0.07 K/uL  Comprehensive metabolic panel   Collection Time: 01/18/19 11:08 AM  Result Value Ref Range   Sodium 138 135 - 145 mmol/L   Potassium 3.4 (L) 3.5 - 5.1 mmol/L   Chloride 109 98 - 111 mmol/L   CO2 21 (L) 22 - 32 mmol/L   Glucose, Bld 88 70 - 99 mg/dL   BUN <5 (L) 6 - 20 mg/dL   Creatinine, Ser 0.53 0.44 - 1.00 mg/dL   Calcium 8.8 (L) 8.9 - 10.3 mg/dL   Total Protein 6.7 6.5 - 8.1 g/dL   Albumin 3.2 (L) 3.5 - 5.0 g/dL   AST 12 (L) 15 - 41 U/L   ALT 10 0 - 44 U/L   Alkaline Phosphatase 68 38 - 126 U/L   Total Bilirubin 0.5 0.3 - 1.2 mg/dL   GFR calc non Af Amer >60 >60 mL/min   GFR calc Af Amer >60 >60 mL/min   Anion gap 8 5 - 15     Constitutional: NAD, AAOx3  HE/ENT: extraocular movements grossly intact, moist mucous membranes CV: RRR PULM: nl respiratory effort, CTABL     Abd: gravid, non-tender, non-distended, soft      Ext: Non-tender, Nonedematous   Psych: mood appropriate, speech normal Pelvic: deferred  Fetal  monitoring: Cat 1 Appropriate for GA Baseline: 135bpm Variability: moderate Accelerations: 10*10 present x >2 Decelerations absent Time 53mins    A/P: 33 y.o. [redacted]w[redacted]d here for  antenatal surveillance for elevated BP, Gestational HTN  Principle Diagnosis:   High risk pregnancy in third trimester Gestational HTN without proteinuria   Labor: not present.   Fetal Wellbeing: Reassuring Cat 1 tracing. Reactive NST   Reviewed POC with Pt, d/w Dr Leafy Ro, will dx GHTN due to severe range BP in office, start weekly NSTs and AFI, growth q4wks, plan delivery 37-38wks  D/c home stable, precautions reviewed, follow-up as  scheduled.    Francetta Found, CNM 01/18/2019  5:29 PM

## 2019-01-31 ENCOUNTER — Other Ambulatory Visit: Payer: Self-pay

## 2019-01-31 ENCOUNTER — Observation Stay
Admission: RE | Admit: 2019-01-31 | Discharge: 2019-01-31 | Disposition: A | Discharge status: Home / Self Care | Payer: Managed Care, Other (non HMO) | Attending: Obstetrics & Gynecology | Admitting: Obstetrics & Gynecology

## 2019-01-31 DIAGNOSIS — J45909 Unspecified asthma, uncomplicated: Secondary | ICD-10-CM | POA: Insufficient documentation

## 2019-01-31 DIAGNOSIS — O99513 Diseases of the respiratory system complicating pregnancy, third trimester: Secondary | ICD-10-CM | POA: Diagnosis not present

## 2019-01-31 DIAGNOSIS — K219 Gastro-esophageal reflux disease without esophagitis: Secondary | ICD-10-CM | POA: Insufficient documentation

## 2019-01-31 DIAGNOSIS — Z3A34 34 weeks gestation of pregnancy: Secondary | ICD-10-CM | POA: Insufficient documentation

## 2019-01-31 DIAGNOSIS — O99613 Diseases of the digestive system complicating pregnancy, third trimester: Secondary | ICD-10-CM | POA: Insufficient documentation

## 2019-01-31 DIAGNOSIS — O133 Gestational [pregnancy-induced] hypertension without significant proteinuria, third trimester: Principal | ICD-10-CM | POA: Insufficient documentation

## 2019-01-31 DIAGNOSIS — R030 Elevated blood-pressure reading, without diagnosis of hypertension: Secondary | ICD-10-CM | POA: Diagnosis present

## 2019-01-31 LAB — URINALYSIS, ROUTINE W REFLEX MICROSCOPIC
Bacteria, UA: NONE SEEN
Bilirubin Urine: NEGATIVE
Glucose, UA: NEGATIVE mg/dL
Ketones, ur: NEGATIVE mg/dL
Leukocytes,Ua: NEGATIVE
Nitrite: NEGATIVE
Protein, ur: NEGATIVE mg/dL
Specific Gravity, Urine: 1.001 — ABNORMAL LOW (ref 1.005–1.030)
pH: 6 (ref 5.0–8.0)

## 2019-01-31 LAB — COMPREHENSIVE METABOLIC PANEL
ALT: 9 U/L (ref 0–44)
AST: 15 U/L (ref 15–41)
Albumin: 3.2 g/dL — ABNORMAL LOW (ref 3.5–5.0)
Alkaline Phosphatase: 67 U/L (ref 38–126)
Anion gap: 11 (ref 5–15)
BUN: 7 mg/dL (ref 6–20)
CO2: 19 mmol/L — ABNORMAL LOW (ref 22–32)
Calcium: 8.5 mg/dL — ABNORMAL LOW (ref 8.9–10.3)
Chloride: 104 mmol/L (ref 98–111)
Creatinine, Ser: 0.62 mg/dL (ref 0.44–1.00)
GFR calc Af Amer: >60 mL/min (ref >60)
GFR calc non Af Amer: >60 mL/min (ref >60)
Glucose, Bld: 137 mg/dL — ABNORMAL HIGH (ref 70–99)
Potassium: 3.1 mmol/L — ABNORMAL LOW (ref 3.5–5.1)
Sodium: 134 mmol/L — ABNORMAL LOW (ref 135–145)
Total Bilirubin: 0.4 mg/dL (ref 0.3–1.2)
Total Protein: 6.6 g/dL (ref 6.5–8.1)

## 2019-01-31 LAB — PROTEIN / CREATININE RATIO, URINE
Creatinine, Urine: 20 mg/dL
Total Protein, Urine: <6 mg/dL

## 2019-01-31 LAB — CBC
HCT: 31.4 % — ABNORMAL LOW (ref 36.0–46.0)
Hemoglobin: 10.9 g/dL — ABNORMAL LOW (ref 12.0–15.0)
MCH: 32.5 pg (ref 26.0–34.0)
MCHC: 34.7 g/dL (ref 30.0–36.0)
MCV: 93.7 fL (ref 80.0–100.0)
Platelets: 235 10*3/uL (ref 150–400)
RBC: 3.35 MIL/uL — ABNORMAL LOW (ref 3.87–5.11)
RDW: 12.6 % (ref 11.5–15.5)
WBC: 8.2 10*3/uL (ref 4.0–10.5)
nRBC: 0 % (ref 0.0–0.2)

## 2019-01-31 LAB — TSH: TSH: 0.221 u[IU]/mL — ABNORMAL LOW (ref 0.350–4.500)

## 2019-01-31 LAB — TYPE AND SCREEN
ABO/RH(D): O POS
Antibody Screen: NEGATIVE

## 2019-01-31 MED ORDER — BETAMETHASONE SOD PHOS & ACET 6 (3-3) MG/ML IJ SUSP
12.0000 mg | INTRAMUSCULAR | Status: DC
  Administered 2019-01-31: 12 mg via INTRAMUSCULAR

## 2019-01-31 NOTE — OB Triage Note (Signed)
Lab results reviewed by MD. Verbal Order obtained to discharge patient. Patient to return to L&D tomorrow at 1520 for second BMZ injection. Pt verbalized understanding of d/c instructions and has no further questions at this time.

## 2019-01-31 NOTE — Discharge Summary (Signed)
Paula Padilla is a 33 y.o. female. She is at 62w3dgestation. Patient's last menstrual period was 06/04/2018. Estimated Date of Delivery: 03/11/19  [facility] Chief complaint: sent from office due to elevated blood pressure - for labs and extended monitoring. Report some floaters in her vision, and epigastric pain in the setting of GERD.  +n/v on occasion.  S: Resting comfortably. no CTX, no VB.no LOF,  Active fetal movement.  Denies: HA, SOB, or RUQ   Maternal Medical History:   Past Medical History:  Diagnosis Date  . Asthma   . Asthma without status asthmaticus    uses inhaler every spring 3-4x/week  . Esophageal motility disorder   . Fundic gland polyps of stomach, benign   . Gastritis   . GERD (gastroesophageal reflux disease)   . Hyperemesis   . [place]     Past Surgical History:  Procedure Laterality Date  . ESOPHAGOGASTRODUODENOSCOPY (EGD) WITH PROPOFOL N/A 06/26/2015   Procedure: ESOPHAGOGASTRODUODENOSCOPY (EGD) WITH PROPOFOL;  Surgeon: MLollie Sails MD;  [facility];  Service: Endoscopy;  Laterality: N/A;  . ESOPHAGOGASTRODUODENOSCOPY (EGD) WITH PROPOFOL N/A 08/27/2015   Procedure: ESOPHAGOGASTRODUODENOSCOPY (EGD) WITH PROPOFOL;  Surgeon: MLollie Sails MD;  [facility];  Service: Endoscopy;  Laterality: N/A;  . WISDOM TOOTH EXTRACTION      Allergies  Allergen Reactions  . Peanut Oil Swelling    Throat swelling  . Corn Oil Itching and Rash    Throat swelling  . Wheat Bran Itching and Rash    Throat swelling    Prior to Admission medications   Medication Sig Start Date End Date Taking? Authorizing Provider  albuterol (PROVENTIL HFA;VENTOLIN HFA) 108 (90 BASE) MCG/ACT inhaler Inhale 2 puffs into the lungs every 6 (six) hours as needed for wheezing or shortness of breath.   Yes [provider]  aspirin 81 MG chewable tablet Chew by mouth daily.   Yes [provider]  Azelastine-Fluticasone 137-50 MCG/ACT SUSP Place 2 sprays into the nose daily.   Yes [provider]  metoCLOPramide (REGLAN) 10 MG tablet Take 10 mg by mouth every 6 (six) hours as needed for nausea.   Yes [provider]  pantoprazole (PROTONIX) 40 MG tablet Take 40 mg by mouth 2 (two) times daily.   Yes [provider]  Prenatal Vit-Fe Fumarate-FA (PRENATAL MULTIVITAMIN) TABS tablet Take 1 tablet by mouth daily at 12 noon.   Yes [provider]  promethazine (PHENERGAN) 25 MG suppository Place 25 mg rectally every 6 (six) hours as needed for nausea or vomiting.    [provider]     Social History: She  reports that she has never smoked. She has never used smokeless tobacco. She reports that she does not drink alcohol or use drugs.  Family History: family history includes Alcohol abuse in her father; Asthma in her son and son; Cirrhosis in her father; Heart attack in her father; Heart disease in her father.  Review of Systems: A full review of systems was performed and negative except as noted in the HPI.     O:  BP 114/73   Pulse 89   Temp 98.7 F (37.1 C) (Oral)   Resp 16   LMP 06/04/2018  Results for orders placed or performed during the hospital encounter of 01/31/19 (from the past 48 hour(s))  Comprehensive metabolic panel   Collection Time: 01/31/19  3:06 PM  Result Value Ref Range  Sodium 134 (L) 135 - 145 mmol/L   Potassium 3.1 (L) 3.5 - 5.1 mmol/L   Chloride 104 98 - 111 mmol/L   CO2 19 (L) 22 - 32 mmol/L   Glucose, Bld 137 (H) 70 - 99 mg/dL   BUN 7 6 - 20 mg/dL   Creatinine, Ser 0.62 0.44 - 1.00 mg/dL   Calcium 8.5 (L) 8.9 - 10.3 mg/dL   Total Protein 6.6 6.5 - 8.1 g/dL   Albumin 3.2 (L) 3.5 - 5.0 g/dL   AST 15 15 - 41 U/L   ALT 9 0 - 44 U/L   Alkaline Phosphatase 67 38 - 126 U/L   Total Bilirubin 0.4 0.3 - 1.2 mg/dL   GFR calc non Af Amer >60 >60 mL/min   GFR calc Af Amer >60 >60 mL/min   Anion gap  11 5 - 15  CBC on admission   Collection Time: 01/31/19  3:06 PM  Result Value Ref Range   WBC 8.2 4.0 - 10.5 K/uL   RBC 3.35 (L) 3.87 - 5.11 MIL/uL   Hemoglobin 10.9 (L) 12.0 - 15.0 g/dL   HCT 31.4 (L) 36.0 - 46.0 %   MCV 93.7 80.0 - 100.0 fL   MCH 32.5 26.0 - 34.0 pg   MCHC 34.7 30.0 - 36.0 g/dL   RDW 12.6 11.5 - 15.5 %   Platelets 235 150 - 400 K/uL   nRBC 0.0 0.0 - 0.2 %  TSH   Collection Time: 01/31/19  3:06 PM  Result Value Ref Range   TSH 0.221 (L) 0.350 - 4.500 uIU/mL  Type and screen [place]   Collection Time: 01/31/19  3:07 PM  Result Value Ref Range   ABO/RH(D) O POS    Antibody Screen NEG    Sample Expiration      02/03/2019 Performed at [facility], [address] [place], [place] [address]   Urinalysis, Routine w reflex microscopic   Collection Time: 01/31/19  3:09 PM  Result Value Ref Range   Color, Urine COLORLESS (A) YELLOW   APPearance CLEAR (A) CLEAR   Specific Gravity, Urine 1.001 (L) 1.005 - 1.030   pH 6.0 5.0 - 8.0   Glucose, UA NEGATIVE NEGATIVE mg/dL   Hgb urine dipstick SMALL (A) NEGATIVE   Bilirubin Urine NEGATIVE NEGATIVE   Ketones, ur NEGATIVE NEGATIVE mg/dL   Protein, ur NEGATIVE NEGATIVE mg/dL   Nitrite NEGATIVE NEGATIVE   Leukocytes,Ua NEGATIVE NEGATIVE   RBC / HPF 0-5 0 - 5 RBC/hpf   WBC, UA 0-5 0 - 5 WBC/hpf   Bacteria, UA NONE SEEN NONE SEEN   Squamous Epithelial / LPF 0-5 0 - 5     Constitutional: NAD, AAOx3  HE/ENT: extraocular movements grossly intact, moist mucous membranes CV: RRR PULM: nl respiratory effort, CTABL     Abd: gravid, non-tender, non-distended, soft      Ext: Non-tender, Nonedmeatous   Psych: mood appropriate, speech normal Pelvic deferred  NST:  Baseline: 135 Variability: moderate Accelerations present x >2 Decelerations absent Time 72mns    Assessment: 33y.o. 317w3dere for antenatal surveillance during pregnancy.  Principle diagnosis: gestational  hypertension  Plan:  Labor: not present.   Fetal Wellbeing: Reassuring Cat 1 tracing.  Reactive NST   Preeclampsia - no severe criteria met, no mag sulfate or delivery indicated.  Due to worsening elevated pressures at early gestation, it is possible that she may deliver sooner than 37 weeks.  It is not unreasonable to  plan for this, and offer BMZ in case.  One dose today, second in 24hrs.  GERD: famitodine scheduled. +tums/MOM  D/c home stable, precautions reviewed, follow-up as scheduled.   ----- Larey Days, MD Attending Obstetrician and Gynecologist [facility], Department of [facility]

## 2019-01-31 NOTE — OB Triage Note (Signed)
Pt is a 33y.o G3P2 at [redacted]w[redacted]d that presents from Office with complaints of Epigastric pain over the last week and pt has hx of GERD, N/V, and elevated Blood pressure. Pt denies headache but states floaters that started around lunchtime today. Pt denies VB, LOF and states positive FM. Initial BP 128/75 and temp 98.7. reflexes plus 2, no clonus and non pitting edema present in bilateral extremities. Monitors applied and initial FHT 145 with monitors applied and assessing.

## 2019-02-01 ENCOUNTER — Inpatient Hospital Stay
Admission: RE | Admit: 2019-02-01 | Discharge: 2019-02-01 | Disposition: A | Discharge status: Home / Self Care | Payer: Managed Care, Other (non HMO) | Attending: Obstetrics & Gynecology | Admitting: Obstetrics & Gynecology

## 2019-02-01 DIAGNOSIS — Z3A00 Weeks of gestation of pregnancy not specified: Secondary | ICD-10-CM | POA: Insufficient documentation

## 2019-02-01 DIAGNOSIS — O09899 Supervision of other high risk pregnancies, unspecified trimester: Secondary | ICD-10-CM | POA: Insufficient documentation

## 2019-02-01 MED ORDER — BETAMETHASONE SOD PHOS & ACET 6 (3-3) MG/ML IJ SUSP
12.0000 mg | Freq: Once | INTRAMUSCULAR | Status: AC
  Administered 2019-02-01: 16:00:00 12 mg via INTRAMUSCULAR

## 2019-02-14 ENCOUNTER — Other Ambulatory Visit: Payer: Self-pay | Admitting: Obstetrics & Gynecology

## 2019-02-14 LAB — OB RESULTS CONSOLE GBS: GBS: NEGATIVE

## 2019-02-14 LAB — OB RESULTS CONSOLE GC/CHLAMYDIA
Chlamydia: NEGATIVE
Gonorrhea: NEGATIVE

## 2019-02-16 ENCOUNTER — Other Ambulatory Visit: Payer: Self-pay | Admitting: Obstetrics and Gynecology

## 2019-02-16 NOTE — Progress Notes (Signed)
IOL orders

## 2019-02-17 ENCOUNTER — Inpatient Hospital Stay: Admission: RE | Admit: 2019-02-17 | Payer: Managed Care, Other (non HMO) | Source: Ambulatory Visit

## 2019-02-18 ENCOUNTER — Other Ambulatory Visit: Payer: Self-pay

## 2019-02-18 ENCOUNTER — Inpatient Hospital Stay
Admission: AD | Admit: 2019-02-18 | Discharge: 2019-02-20 | DRG: 785 | Disposition: A | Discharge status: Home / Self Care | Payer: Managed Care, Other (non HMO) | Attending: Obstetrics and Gynecology | Admitting: Obstetrics and Gynecology

## 2019-02-18 ENCOUNTER — Encounter: Payer: Self-pay | Admitting: *Deleted

## 2019-02-18 ENCOUNTER — Encounter
Admission: AD | Disposition: A | Discharge status: Home / Self Care | Payer: Self-pay | Source: Home / Self Care | Attending: Obstetrics and Gynecology

## 2019-02-18 ENCOUNTER — Inpatient Hospital Stay: Payer: Managed Care, Other (non HMO) | Admitting: Anesthesiology

## 2019-02-18 DIAGNOSIS — O134 Gestational [pregnancy-induced] hypertension without significant proteinuria, complicating childbirth: Principal | ICD-10-CM | POA: Diagnosis present

## 2019-02-18 DIAGNOSIS — R6339 Other feeding difficulties: Secondary | ICD-10-CM

## 2019-02-18 DIAGNOSIS — O365930 Maternal care for other known or suspected poor fetal growth, third trimester, not applicable or unspecified: Secondary | ICD-10-CM | POA: Diagnosis present

## 2019-02-18 DIAGNOSIS — J45909 Unspecified asthma, uncomplicated: Secondary | ICD-10-CM | POA: Diagnosis present

## 2019-02-18 DIAGNOSIS — Z1159 Encounter for screening for other viral diseases: Secondary | ICD-10-CM | POA: Diagnosis not present

## 2019-02-18 DIAGNOSIS — O322XX0 Maternal care for transverse and oblique lie, not applicable or unspecified: Secondary | ICD-10-CM | POA: Diagnosis present

## 2019-02-18 DIAGNOSIS — Z3A37 37 weeks gestation of pregnancy: Secondary | ICD-10-CM

## 2019-02-18 DIAGNOSIS — O99214 Obesity complicating childbirth: Secondary | ICD-10-CM | POA: Diagnosis present

## 2019-02-18 DIAGNOSIS — O9952 Diseases of the respiratory system complicating childbirth: Secondary | ICD-10-CM | POA: Diagnosis present

## 2019-02-18 DIAGNOSIS — Z302 Encounter for sterilization: Secondary | ICD-10-CM | POA: Diagnosis not present

## 2019-02-18 DIAGNOSIS — O133 Gestational [pregnancy-induced] hypertension without significant proteinuria, third trimester: Secondary | ICD-10-CM | POA: Diagnosis present

## 2019-02-18 DIAGNOSIS — R633 Feeding difficulties: Secondary | ICD-10-CM

## 2019-02-18 LAB — PROTEIN / CREATININE RATIO, URINE
Creatinine, Urine: 82 mg/dL
Total Protein, Urine: <6 mg/dL

## 2019-02-18 LAB — SARS CORONAVIRUS 2 BY RT PCR (HOSPITAL ORDER, PERFORMED IN ~~LOC~~ HOSPITAL LAB): SARS Coronavirus 2: NEGATIVE

## 2019-02-18 LAB — CBC
HCT: 30.6 % — ABNORMAL LOW (ref 36.0–46.0)
Hemoglobin: 10.4 g/dL — ABNORMAL LOW (ref 12.0–15.0)
MCH: 31.9 pg (ref 26.0–34.0)
MCHC: 34 g/dL (ref 30.0–36.0)
MCV: 93.9 fL (ref 80.0–100.0)
Platelets: 233 10*3/uL (ref 150–400)
RBC: 3.26 MIL/uL — ABNORMAL LOW (ref 3.87–5.11)
RDW: 13 % (ref 11.5–15.5)
WBC: 7.4 10*3/uL (ref 4.0–10.5)
nRBC: 0 % (ref 0.0–0.2)

## 2019-02-18 LAB — COMPREHENSIVE METABOLIC PANEL
ALT: 10 U/L (ref 0–44)
AST: 11 U/L — ABNORMAL LOW (ref 15–41)
Albumin: 2.9 g/dL — ABNORMAL LOW (ref 3.5–5.0)
Alkaline Phosphatase: 81 U/L (ref 38–126)
Anion gap: 9 (ref 5–15)
BUN: 6 mg/dL (ref 6–20)
CO2: 19 mmol/L — ABNORMAL LOW (ref 22–32)
Calcium: 8.4 mg/dL — ABNORMAL LOW (ref 8.9–10.3)
Chloride: 106 mmol/L (ref 98–111)
Creatinine, Ser: 0.69 mg/dL (ref 0.44–1.00)
GFR calc Af Amer: >60 mL/min (ref >60)
GFR calc non Af Amer: >60 mL/min (ref >60)
Glucose, Bld: 86 mg/dL (ref 70–99)
Potassium: 3.6 mmol/L (ref 3.5–5.1)
Sodium: 134 mmol/L — ABNORMAL LOW (ref 135–145)
Total Bilirubin: 0.5 mg/dL (ref 0.3–1.2)
Total Protein: 6.5 g/dL (ref 6.5–8.1)

## 2019-02-18 LAB — TYPE AND SCREEN
ABO/RH(D): O POS
Antibody Screen: NEGATIVE

## 2019-02-18 SURGERY — Surgical Case
Anesthesia: Epidural | Site: Abdomen

## 2019-02-18 MED ORDER — ACETAMINOPHEN 650 MG RE SUPP
650.0000 mg | Freq: Once | RECTAL | Status: AC
  Administered 2019-02-18: 650 mg via RECTAL
  Filled 2019-02-18: qty 1

## 2019-02-18 MED ORDER — LIDOCAINE HCL (PF) 1 % IJ SOLN
INTRAMUSCULAR | Status: AC
  Filled 2019-02-18: qty 30

## 2019-02-18 MED ORDER — FENTANYL CITRATE (PF) 100 MCG/2ML IJ SOLN
50.0000 ug | INTRAMUSCULAR | Status: DC | PRN
  Administered 2019-02-18: 100 ug via INTRAVENOUS
  Filled 2019-02-18: qty 2

## 2019-02-18 MED ORDER — BUPIVACAINE HCL (PF) 0.25 % IJ SOLN
INTRAMUSCULAR | Status: DC | PRN
  Administered 2019-02-18: 10 mL via EPIDURAL

## 2019-02-18 MED ORDER — AMMONIA AROMATIC IN INHA
RESPIRATORY_TRACT | Status: AC
  Filled 2019-02-18: qty 10

## 2019-02-18 MED ORDER — OXYTOCIN 40 UNITS IN NORMAL SALINE INFUSION - SIMPLE MED
INTRAVENOUS | Status: AC
  Filled 2019-02-18: qty 1000

## 2019-02-18 MED ORDER — OXYTOCIN 40 UNITS IN NORMAL SALINE INFUSION - SIMPLE MED
1.0000 m[IU]/min | INTRAVENOUS | Status: DC
  Administered 2019-02-18: 2 m[IU]/min via INTRAVENOUS

## 2019-02-18 MED ORDER — PHENYLEPHRINE 40 MCG/ML (10ML) SYRINGE FOR IV PUSH (FOR BLOOD PRESSURE SUPPORT): 80.0000 ug | PREFILLED_SYRINGE | INTRAVENOUS | Status: DC | PRN

## 2019-02-18 MED ORDER — SODIUM CHLORIDE 0.9% FLUSH
50.0000 mL | Freq: Once | INTRAVENOUS | Status: DC
  Filled 2019-02-18: qty 51

## 2019-02-18 MED ORDER — LIDOCAINE HCL (PF) 1 % IJ SOLN
INTRAMUSCULAR | Status: DC | PRN
  Administered 2019-02-18: 3 mL

## 2019-02-18 MED ORDER — FENTANYL CITRATE (PF) 100 MCG/2ML IJ SOLN
INTRAMUSCULAR | Status: AC
  Filled 2019-02-18: qty 2

## 2019-02-18 MED ORDER — OXYTOCIN 40 UNITS IN NORMAL SALINE INFUSION - SIMPLE MED
2.5000 [IU]/h | INTRAVENOUS | Status: DC
  Administered 2019-02-19: 2.5 [IU]/h via INTRAVENOUS

## 2019-02-18 MED ORDER — FENTANYL 2.5 MCG/ML W/ROPIVACAINE 0.15% IN NS 100 ML EPIDURAL (ARMC)
12.0000 mL/h | EPIDURAL | Status: DC
  Administered 2019-02-18: 12 mL/h via EPIDURAL

## 2019-02-18 MED ORDER — LIDOCAINE HCL (PF) 1 % IJ SOLN: 30.0000 mL | INTRAMUSCULAR | Status: DC | PRN

## 2019-02-18 MED ORDER — MORPHINE SULFATE (PF) 0.5 MG/ML IJ SOLN
INTRAMUSCULAR | Status: AC
  Filled 2019-02-18: qty 10

## 2019-02-18 MED ORDER — PROPOFOL 10 MG/ML IV BOLUS
INTRAVENOUS | Status: AC
  Filled 2019-02-18: qty 20

## 2019-02-18 MED ORDER — LIDOCAINE-EPINEPHRINE (PF) 1.5 %-1:200000 IJ SOLN
INTRAMUSCULAR | Status: DC | PRN
  Administered 2019-02-18: 3 mL via PERINEURAL

## 2019-02-18 MED ORDER — MISOPROSTOL 200 MCG PO TABS
ORAL_TABLET | ORAL | Status: AC
  Filled 2019-02-18: qty 4

## 2019-02-18 MED ORDER — DEXTROSE 5 % IV SOLN
3.0000 g | INTRAVENOUS | Status: AC
  Administered 2019-02-18: 3 g via INTRAVENOUS
  Filled 2019-02-18: qty 3000

## 2019-02-18 MED ORDER — LACTATED RINGERS IV SOLN
500.0000 mL | INTRAVENOUS | Status: DC | PRN
  Administered 2019-02-18: 1000 mL via INTRAVENOUS

## 2019-02-18 MED ORDER — SOD CITRATE-CITRIC ACID 500-334 MG/5ML PO SOLN
30.0000 mL | ORAL | Status: DC | PRN
  Filled 2019-02-18: qty 30

## 2019-02-18 MED ORDER — MISOPROSTOL 25 MCG QUARTER TABLET
25.0000 ug | ORAL_TABLET | ORAL | Status: DC | PRN
  Administered 2019-02-18: 25 ug via BUCCAL
  Filled 2019-02-18: qty 1

## 2019-02-18 MED ORDER — TERBUTALINE SULFATE 1 MG/ML IJ SOLN
0.2500 mg | Freq: Once | INTRAMUSCULAR | Status: AC | PRN
  Administered 2019-02-18: 0.25 mg via SUBCUTANEOUS
  Filled 2019-02-18: qty 1

## 2019-02-18 MED ORDER — LACTATED RINGERS IV SOLN
INTRAVENOUS | Status: DC
  Administered 2019-02-18: 20:00:00 via INTRAVENOUS

## 2019-02-18 MED ORDER — ONDANSETRON HCL 4 MG/2ML IJ SOLN: 4.0000 mg | Freq: Four times a day (QID) | INTRAMUSCULAR | Status: DC | PRN

## 2019-02-18 MED ORDER — FENTANYL 2.5 MCG/ML W/ROPIVACAINE 0.15% IN NS 100 ML EPIDURAL (ARMC)
EPIDURAL | Status: AC
  Filled 2019-02-18: qty 100

## 2019-02-18 MED ORDER — LIDOCAINE HCL (PF) 2 % IJ SOLN
INTRAMUSCULAR | Status: DC | PRN
  Administered 2019-02-18: 20 mg via INTRADERMAL

## 2019-02-18 MED ORDER — EPHEDRINE 5 MG/ML INJ: 10.0000 mg | INTRAVENOUS | Status: DC | PRN

## 2019-02-18 MED ORDER — OXYTOCIN BOLUS FROM INFUSION: 500.0000 mL | Freq: Once | INTRAVENOUS | Status: DC

## 2019-02-18 MED ORDER — BUPIVACAINE HCL (PF) 0.5 % IJ SOLN: 30.0000 mL | Freq: Once | INTRAMUSCULAR | Status: DC

## 2019-02-18 MED ORDER — MISOPROSTOL 25 MCG QUARTER TABLET
25.0000 ug | ORAL_TABLET | ORAL | Status: DC | PRN
  Administered 2019-02-18: 25 ug via VAGINAL
  Filled 2019-02-18: qty 1

## 2019-02-18 MED ORDER — DIPHENHYDRAMINE HCL 50 MG/ML IJ SOLN: 12.5000 mg | INTRAMUSCULAR | Status: DC | PRN

## 2019-02-18 MED ORDER — LACTATED RINGERS IV SOLN: 500.0000 mL | Freq: Once | INTRAVENOUS | Status: DC

## 2019-02-18 MED ORDER — SOD CITRATE-CITRIC ACID 500-334 MG/5ML PO SOLN
30.0000 mL | ORAL | Status: AC
  Administered 2019-02-18: 30 mL via ORAL

## 2019-02-18 MED ORDER — ACETAMINOPHEN 325 MG PO TABS: 650.0000 mg | ORAL_TABLET | ORAL | Status: DC | PRN

## 2019-02-18 MED ORDER — BUPIVACAINE HCL (PF) 0.5 % IJ SOLN
INTRAMUSCULAR | Status: AC
  Filled 2019-02-18: qty 30

## 2019-02-18 MED ORDER — BUPIVACAINE LIPOSOME 1.3 % IJ SUSP
INTRAMUSCULAR | Status: AC
  Filled 2019-02-18: qty 20

## 2019-02-18 MED ORDER — BUPIVACAINE LIPOSOME 1.3 % IJ SUSP: 20.0000 mL | Freq: Once | INTRAMUSCULAR | Status: DC

## 2019-02-18 MED ORDER — SODIUM CHLORIDE (PF) 0.9 % IJ SOLN
INTRAMUSCULAR | Status: AC
  Filled 2019-02-18: qty 50

## 2019-02-18 MED ORDER — LIDOCAINE HCL (PF) 2 % IJ SOLN: INTRAMUSCULAR | Status: DC | PRN

## 2019-02-18 MED ORDER — OXYTOCIN 10 UNIT/ML IJ SOLN
INTRAMUSCULAR | Status: AC
  Filled 2019-02-18: qty 2

## 2019-02-18 MED ORDER — SODIUM CHLORIDE 0.9 % IV SOLN
500.0000 mg | Freq: Once | INTRAVENOUS | Status: AC
  Administered 2019-02-19: 500 mg via INTRAVENOUS
  Filled 2019-02-18: qty 500

## 2019-02-18 SURGICAL SUPPLY — 49 items
BINDER ABDOMINAL 12 ML 46-62 (SOFTGOODS) ×3 IMPLANT
CANISTER SUCT 3000ML PPV (MISCELLANEOUS) ×3 IMPLANT
COVER WAND RF STERILE (DRAPES) IMPLANT
DERMABOND ADVANCED (GAUZE/BANDAGES/DRESSINGS) ×2
DERMABOND ADVANCED .7 DNX12 (GAUZE/BANDAGES/DRESSINGS) ×4 IMPLANT
DRAPE C SECTION CLR SCREEN (DRAPES) ×3 IMPLANT
DRESSING SURGICEL FIBRLLR 1X2 (HEMOSTASIS) ×2 IMPLANT
DRSG SURGICEL FIBRILLAR 1X2 (HEMOSTASIS) ×3
DRSG TELFA 3X8 NADH (GAUZE/BANDAGES/DRESSINGS) IMPLANT
ELECT CAUTERY BLADE 6.4 (BLADE) ×3 IMPLANT
ELECT REM PT RETURN 9FT ADLT (ELECTROSURGICAL) ×3
ELECTRODE REM PT RTRN 9FT ADLT (ELECTROSURGICAL) ×2 IMPLANT
GAUZE SPONGE 4X4 12PLY STRL (GAUZE/BANDAGES/DRESSINGS) IMPLANT
GLOVE BIO SURGEON STRL SZ7 (GLOVE) ×6 IMPLANT
GLOVE BIOGEL PI IND STRL 7.0 (GLOVE) ×4 IMPLANT
GLOVE BIOGEL PI INDICATOR 7.0 (GLOVE) ×2
GLOVE PI ORTHOPRO 6.5 (GLOVE) ×1
GLOVE PI ORTHOPRO STRL 6.5 (GLOVE) ×2 IMPLANT
GLOVE SURG SYN 6.5 ES PF (GLOVE) ×6 IMPLANT
GOWN STRL REUS W/ TWL LRG LVL3 (GOWN DISPOSABLE) ×6 IMPLANT
GOWN STRL REUS W/TWL LRG LVL3 (GOWN DISPOSABLE) ×3
NEEDLE HYPO 22GX1.5 SAFETY (NEEDLE) ×3 IMPLANT
NS IRRIG 1000ML POUR BTL (IV SOLUTION) ×3 IMPLANT
PACK C SECTION AR (MISCELLANEOUS) ×3 IMPLANT
PAD OB MATERNITY 4.3X12.25 (PERSONAL CARE ITEMS) ×6 IMPLANT
PAD PREP 24X41 OB/GYN DISP (PERSONAL CARE ITEMS) ×3 IMPLANT
PENCIL SMOKE ULTRAEVAC 22 CON (MISCELLANEOUS) ×3 IMPLANT
RETRACTOR TRAXI PANNICULUS (MISCELLANEOUS) ×2 IMPLANT
RTRCTR C-SECT PINK 34CM XLRG (MISCELLANEOUS) ×3 IMPLANT
SPONGE LAP 18X18 RF (DISPOSABLE) ×9 IMPLANT
STAPLER INSORB 30 2030 C-SECTI (MISCELLANEOUS) ×3 IMPLANT
STRAP SAFETY 5IN WIDE (MISCELLANEOUS) ×3 IMPLANT
STRIP CLOSURE SKIN 1/2X4 (GAUZE/BANDAGES/DRESSINGS) IMPLANT
SUT MNCRL 4-0 (SUTURE) ×1
SUT MNCRL 4-0 27XMFL (SUTURE) ×2
SUT PDS AB 1 TP1 96 (SUTURE) ×3 IMPLANT
SUT VIC AB 0 CT1 36 (SUTURE) ×6 IMPLANT
SUT VIC AB 0 CTX 36 (SUTURE) ×3
SUT VIC AB 0 CTX36XBRD ANBCTRL (SUTURE) ×6 IMPLANT
SUT VIC AB 2-0 CT1 27 (SUTURE)
SUT VIC AB 2-0 CT1 TAPERPNT 27 (SUTURE) IMPLANT
SUT VIC AB 2-0 SH 27 (SUTURE) ×2
SUT VIC AB 2-0 SH 27XBRD (SUTURE) ×4 IMPLANT
SUT VIC AB 3-0 SH 27 (SUTURE) ×1
SUT VIC AB 3-0 SH 27X BRD (SUTURE) ×2 IMPLANT
SUTURE MNCRL 4-0 27XMF (SUTURE) ×2 IMPLANT
SWABSTK COMLB BENZOIN TINCTURE (MISCELLANEOUS) IMPLANT
SYR 50ML LL SCALE MARK (SYRINGE) ×6 IMPLANT
TRAXI PANNICULUS RETRACTOR (MISCELLANEOUS) ×1

## 2019-02-18 NOTE — Anesthesia Preprocedure Evaluation (Signed)
Anesthesia Evaluation  Patient identified by MRN, date of birth, ID band Patient awake    Reviewed: Allergy & Precautions, NPO status , Patient's Chart, lab work & pertinent test results  Airway Mallampati: II  TM Distance: >3 FB     Dental no notable dental hx.    Pulmonary asthma ,    Pulmonary exam normal        Cardiovascular hypertension, Normal cardiovascular exam     Neuro/Psych negative neurological ROS  negative psych ROS   GI/Hepatic Neg liver ROS, GERD  ,  Endo/Other  negative endocrine ROS  Renal/GU negative Renal ROS  negative genitourinary   Musculoskeletal negative musculoskeletal ROS (+)   Abdominal Normal abdominal exam  (+)   Peds negative pediatric ROS (+)  Hematology negative hematology ROS (+)   Anesthesia Other Findings   Reproductive/Obstetrics (+) Pregnancy                             Anesthesia Physical Anesthesia Plan  ASA: II  Anesthesia Plan: Epidural   Post-op Pain Management:    Induction:   PONV Risk Score and Plan:   Airway Management Planned: Natural Airway  Additional Equipment:   Intra-op Plan:   Post-operative Plan:   Informed Consent: I have reviewed the patients History and Physical, chart, labs and discussed the procedure including the risks, benefits and alternatives for the proposed anesthesia with the patient or authorized representative who has indicated his/her understanding and acceptance.     Dental advisory given  Plan Discussed with: CRNA and Surgeon  Anesthesia Plan Comments:         Anesthesia Quick Evaluation

## 2019-02-18 NOTE — Progress Notes (Signed)
Labor Progress Note  Paula Padilla is a 33 y.o. G3P2002 at [redacted]w[redacted]d by Patient's last menstrual period was 06/04/2018.   Subjective: variable decels and still having some pelvic pain with UCs after epidural.   Objective: BP (!) 138/58 (BP Location: Right Arm)   Pulse (!) 113   Temp 98.1 F (36.7 C) (Oral)   Resp 16   Ht 5\' 7"  (1.702 m)   Wt 126.6 kg   LMP 06/04/2018   SpO2 99%   BMI 43.70 kg/m  Notable VS details: reviewed  Fetal Assessment: FHT:  Cat II tracing, variable decels with moderate variability, deepening variables to 80s with UCs. SVE done, [facility] cath removed, BBOW with fetal head well applied at -1 station, cx 5/50, soft/posterior.  AROM performed for copious amt clear fluid, FHR tracing with decel to 50s; FSE applied, IUPC placed. Maternal position changes Right to Left lateral, to knee chest. Terbutaline 0.25mg  [place] given then amnioinfusion started via IUPC. FHR, minimal variability, 80s baseline with variables to 60s with increasing baseline to 135bpm and moderate variability. Variable decels resolved.   UCs q88min  Category/reactivity:  Category II  SVE:   5/50/-1, soft/posterior Membrane status: AROM, clear fluid at [place]: Lab Results  Component Value Date   WBC 7.4 02/18/2019   HGB 10.4 (L) 02/18/2019   HCT 30.6 (L) 02/18/2019   MCV 93.9 02/18/2019   PLT 233 02/18/2019    Assessment / Plan: IOL at 37+0wk for GHTN  Labor: s/p cytotec and [facility] cath, Now AROM.  Preeclampsia:  no signs or symptoms of toxicity Fetal Wellbeing:  Category II tracing now recovered. Reviewed tracing with Dr Leonides Schanz.  Pain Control:  Epidural I/D:  n/a Anticipated MOD:  NSVD  Late note entry  Francetta Found, CNM 02/18/2019, 10:24 PM

## 2019-02-18 NOTE — Progress Notes (Signed)
Labor Progress Note  Paula Padilla is a 33 y.o. G3P2002 at [redacted]w[redacted]d by LMP admitted for IOl due to [facility]  Subjective: feeling pressure and discomfort at her cervix only, not rectal pressure.   Objective: BP (!) 138/58 (BP Location: Right Arm)   Pulse (!) 113   Temp 98.1 F (36.7 C) (Oral)   Resp 16   Ht 5\' 7"  (1.702 m)   Wt 126.6 kg   LMP 06/04/2018   SpO2 98%   BMI 43.70 kg/m  Notable VS details: reviewed  Fetal Assessment: FHT:  FHR: 135 bpm, variability: moderate,  accelerations:  Present,  decelerations:  Absent Category/reactivity:  Cat I until exam- see below.   UC:   regular, every 3-4 minutes, MVUs 145; pitocin at 42mu/min SVE:   5-6/50/-2, fetal hand and arm noted across head and protruding through cervix into vagina. attempted to touch hand to cause reflexive pulling back. During exam, noted variable decel to 65bpm and stopped exam.   Membrane status: clear fluid   Labs: Lab Results  Component Value Date   WBC 7.4 02/18/2019   HGB 10.4 (L) 02/18/2019   HCT 30.6 (L) 02/18/2019   MCV 93.9 02/18/2019   PLT 233 02/18/2019    Assessment / Plan: Fetal malpresentation with compound arm  Labor: minmial progression since AROM/prolonged decel 2hrs ago but inadequate UC strength.  Preeclampsia:  no e/o pre-e, stable labs on admit.  Fetal Wellbeing:  Category II, single isolate variable during exam. Pain Control:  Epidural I/D:  n/a Anticipated MOD: D/w Dr Leonides Schanz regarding malpresentation now noted. Pt notified of likely need for CS, pt requests BTL if CS is done. States 100% sure no more babies and desires permanent sterilization.   Francetta Found, CNM 02/18/2019, 10:36 PM

## 2019-02-18 NOTE — Progress Notes (Signed)
Called by CNM McVey to report hand presentation.  After AROM  @ 1952, there was a deceleration x 7 minutes, during which several interventions were performed.  IUPC was planned for contraction monitoring, and FSE was placed as a result of the deceleration.  Head was well applied.   Fetal cardiac tracing returned to baseline, primarily category 1 with accels.  I was informed of this after recovery of this deceleration and reviewed the strip.  At 22:03 I received a phone call from CNM McVey to report a hand presentation, with cervix at 5cm and swelling.  Attempts to reduce the hand were performed, and no significant reduction was made.  I arrived and attempted in a variety of positions including knee/chest but was unsuccessful as well.  Fetal head was high, and unable to be assessed for position.  The cervical exam was exquisitely painful to patient with epidural.  She states she feels like she is being ripped apart.  She was quite tolerant of the cervical exam in the office earlier this week.  Due to the inability to reduce the fetal arm and hand presentation, we discussed the recommendation of cesarean delivery, and risks/benefits to proceeding in labor vs cesarean.  Patient has elected cesarean delivery and requests tubal ligation at time of surgery.  She was made aware that this procedure is permanent and cannot be reversed.  She stated she understood and desires this method of pregnancy prevention.    Consents were signed for cesarean delivery and bilateral tubal ligation. Anesthesia has met with patient and will pull epidural and place spinal. We will report to OR when team is ready. All questions were answered to patient's satisfaction  ----- Chelsea Ward, MD, FACOG Attending Obstetrician and Gynecologist [facility], Department of OB/GYN [facility]  

## 2019-02-18 NOTE — H&P (Signed)
OB History & Physical   History of Present Illness:  Chief Complaint: induction  HPI:  Paula Padilla is a 33 y.o. G44P2002 female at [redacted]w[redacted]d dated by LMP and c/w [place] at [redacted]w[redacted]d.  She presents to L&D for induction of labor due to [facility]. Reports active FM, with occasional UCs, denies LOF or VB. Denies HA, VD or RUQ pain.    Pregnancy Issues: 1. GHTN this preg, Pre-e x 2 previous. 2. Obesity, BMI 40.4 3. Hx Thyroid nodules, normal thyroid labs 4. Bulging cervical discs   Maternal Medical History:   Past Medical History:  Diagnosis Date  . Asthma   . Asthma without status asthmaticus    uses inhaler every spring 3-4x/week  . Esophageal motility disorder   . Fundic gland polyps of stomach, benign   . Gastritis   . GERD (gastroesophageal reflux disease)   . Hyperemesis   . Monilial esophagitis ([place])     Past Surgical History:  Procedure Laterality Date  . ESOPHAGOGASTRODUODENOSCOPY (EGD) WITH PROPOFOL N/A 06/26/2015   Procedure: ESOPHAGOGASTRODUODENOSCOPY (EGD) WITH PROPOFOL;  Surgeon: Lollie Sails, MD;  Location: [facility] ENDOSCOPY;  Service: Endoscopy;  Laterality: N/A;  . ESOPHAGOGASTRODUODENOSCOPY (EGD) WITH PROPOFOL N/A 08/27/2015   Procedure: ESOPHAGOGASTRODUODENOSCOPY (EGD) WITH PROPOFOL;  Surgeon: Lollie Sails, MD;  Location: [facility] ENDOSCOPY;  Service: Endoscopy;  Laterality: N/A;  . WISDOM TOOTH EXTRACTION      Allergies  Allergen Reactions  . Peanut Oil Swelling    Throat swelling  . Corn Oil Itching and Rash    Throat swelling  . Wheat Bran Itching and Rash    Throat swelling    Prior to Admission medications   Medication Sig Start Date End Date Taking? Authorizing Provider  albuterol (PROVENTIL HFA;VENTOLIN HFA) 108 (90 BASE) MCG/ACT inhaler Inhale 2 puffs into the lungs every 6 (six) hours as needed for wheezing or shortness of breath.   Yes [provider]  aspirin 81 MG chewable tablet Chew by mouth daily.   Yes [provider]   Azelastine-Fluticasone 137-50 MCG/ACT SUSP Place 2 sprays into the nose daily.   Yes [provider]  pantoprazole (PROTONIX) 40 MG tablet Take 40 mg by mouth 2 (two) times daily.   Yes [provider]  Prenatal Vit-Fe Fumarate-FA (PRENATAL MULTIVITAMIN) TABS tablet Take 1 tablet by mouth daily at 12 noon.   Yes [provider]  metoCLOPramide (REGLAN) 10 MG tablet Take 10 mg by mouth every 6 (six) hours as needed for nausea.    [provider]  promethazine (PHENERGAN) 25 MG suppository Place 25 mg rectally every 6 (six) hours as needed for nausea or vomiting.    [provider]     Prenatal care site: [facility]    Social History: She  reports that she has never smoked. She has never used smokeless tobacco. She reports that she does not drink alcohol or use drugs.  Family History: family history includes Alcohol abuse in her father; Asthma in her son and son; Cirrhosis in her father; Heart attack in her father; Heart disease in her father.   Review of Systems: A full review of systems was performed and negative except as noted in the HPI.     Physical Exam:  Vital Signs: BP (!) 163/94   Pulse 94   Temp 98.5 F (36.9 C) (Oral)   Ht 5\' 7"  (1.702 m)   Wt 126.6 kg   LMP 06/04/2018   BMI 43.70 kg/m  General: no acute distress.  HEENT: normocephalic, atraumatic Heart: regular rate & rhythm.  No murmurs/rubs/gallops Lungs: clear to auscultation bilaterally, normal respiratory effort Abdomen: soft, gravid, non-tender;  EFW: 8lbs Pelvic:  Cook cath placed on admission, uterine balloon 83ml, vaginal balloon 73ml  External: Normal external female genitalia  Cervix: 1/30/-3, firm, posterior   Extremities: non-tender, symmetric, 1+ LE edema bilaterally.  DTRs: 2+  Neurologic: Alert & oriented x 3.    Results for orders placed or performed during the hospital encounter of 02/18/19 (from the past 24 hour(s))  SARS Coronavirus 2  (CEPHEID - Performed in [place] hospital lab), [facility] Order     Status: None   Collection Time: 02/18/19 10:28 AM  Result Value Ref Range   SARS Coronavirus 2 NEGATIVE NEGATIVE  Protein / creatinine ratio, urine     Status: None   Collection Time: 02/18/19 11:18 AM  Result Value Ref Range   Creatinine, Urine 82 mg/dL   Total Protein, Urine <6 mg/dL   Protein Creatinine Ratio        0.00 - 0.15 mg/mg[Cre]  CBC     Status: Abnormal   Collection Time: 02/18/19 11:21 AM  Result Value Ref Range   WBC 7.4 4.0 - 10.5 K/uL   RBC 3.26 (L) 3.87 - 5.11 MIL/uL   Hemoglobin 10.4 (L) 12.0 - 15.0 g/dL   HCT 30.6 (L) 36.0 - 46.0 %   MCV 93.9 80.0 - 100.0 fL   MCH 31.9 26.0 - 34.0 pg   MCHC 34.0 30.0 - 36.0 g/dL   RDW 13.0 11.5 - 15.5 %   Platelets 233 150 - 400 K/uL   nRBC 0.0 0.0 - 0.2 %  Comprehensive metabolic panel     Status: Abnormal   Collection Time: 02/18/19 11:21 AM  Result Value Ref Range   Sodium 134 (L) 135 - 145 mmol/L   Potassium 3.6 3.5 - 5.1 mmol/L   Chloride 106 98 - 111 mmol/L   CO2 19 (L) 22 - 32 mmol/L   Glucose, Bld 86 70 - 99 mg/dL   BUN 6 6 - 20 mg/dL   Creatinine, Ser 0.69 0.44 - 1.00 mg/dL   Calcium 8.4 (L) 8.9 - 10.3 mg/dL   Total Protein 6.5 6.5 - 8.1 g/dL   Albumin 2.9 (L) 3.5 - 5.0 g/dL   AST 11 (L) 15 - 41 U/L   ALT 10 0 - 44 U/L   Alkaline Phosphatase 81 38 - 126 U/L   Total Bilirubin 0.5 0.3 - 1.2 mg/dL   GFR calc non Af Amer >60 >60 mL/min   GFR calc Af Amer >60 >60 mL/min   Anion gap 9 5 - 15  Type and screen     Status: None   Collection Time: 02/18/19 11:21 AM  Result Value Ref Range   ABO/RH(D) O POS    Antibody Screen NEG    Sample Expiration      02/21/2019,2359 Performed at [facility], [address], [place], [place] [address]     Pertinent Results:  Prenatal Labs: Blood type/Rh  O pos  Antibody screen neg  Rubella Immune  Varicella Immune  RPR NR  HBsAg Neg  HIV NR  GC neg  Chlamydia neg  Genetic screening  negative  1 hour GTT  102  GBS  negative   FHT: 130bpm, mod variability, + accels, no decels TOCO: Irregular SVE:   1/30/-3, firm, posterior   Cephalic by leopolds and bedside US  No results  found.  Assessment:  Paula Padilla is a 33 y.o. G30P2002 female at [redacted]w[redacted]d with GHTN.   Plan:  1. Admit to Labor & Delivery; consents reviewed and obtained  2. Fetal Well being  - Fetal Tracing: Cat I tracing - Group B Streptococcus ppx indicated: negative - Presentation: cephalic confirmed by bedside US   3. Routine OB: - Prenatal labs reviewed, as above - Rh O pos - CBC, T&S, RPR on admit - Clear fluids, IVF  4. Induction of Labor -  Contractions: external toco in place -  Pelvis proven to 7#2 -  Plan for induction with Cytotec and Chaves for continuous fetal monitoring  -  Maternal pain control as desired - Anticipate vaginal delivery  5. Post Partum Planning: - Infant feeding: breast - Contraception: TBD  Francetta Found, CNM 02/18/19 2:42 PM

## 2019-02-18 NOTE — Anesthesia Procedure Notes (Signed)
Epidural Patient location during procedure: OB Start time: 02/18/2019 6:09 PM End time: 02/18/2019 6:18 PM  Staffing Anesthesiologist: Alvin Critchley, MD Performed: anesthesiologist   Preanesthetic Checklist Completed: patient identified, site marked, surgical consent, pre-op evaluation, timeout performed, IV checked, risks and benefits discussed and monitors and equipment checked  Epidural Patient position: sitting Prep: Betadine Patient monitoring: heart rate, continuous pulse ox and blood pressure Approach: midline Location: L3-L4 Injection technique: LOR air  Needle:  Needle type: Tuohy  Needle gauge: 17 G Needle length: 9 cm and 9 Catheter type: closed end flexible Catheter size: 19 Gauge Test dose: negative and 1.5% lidocaine with Epi 1:200 K  Assessment Events: blood not aspirated, injection not painful, no injection resistance, negative IV test and no paresthesia  Additional Notes Time out called.  Patient placed in sitting position.  Back prepped and draped in sterile fashion.  A skin wheal was made in the L3-L4 interspace with 1% Lidocaine plain.  A 17G Tuohy needle was advanced into the epidural space by a loss of resistance technique.  No blood or paresthesias.  The epidural catheter advanced easily and the TD was negative.  Easy insertion X 1 and the patient tolerated the procedure well.Reason for block:procedure for pain

## 2019-02-19 LAB — CBC
HCT: 29.4 % — ABNORMAL LOW (ref 36.0–46.0)
Hemoglobin: 9.7 g/dL — ABNORMAL LOW (ref 12.0–15.0)
MCH: 31.2 pg (ref 26.0–34.0)
MCHC: 33 g/dL (ref 30.0–36.0)
MCV: 94.5 fL (ref 80.0–100.0)
Platelets: 224 10*3/uL (ref 150–400)
RBC: 3.11 MIL/uL — ABNORMAL LOW (ref 3.87–5.11)
RDW: 13 % (ref 11.5–15.5)
WBC: 11.4 10*3/uL — ABNORMAL HIGH (ref 4.0–10.5)
nRBC: 0 % (ref 0.0–0.2)

## 2019-02-19 LAB — SYPHILIS: RPR W/REFLEX TO RPR TITER AND TREPONEMAL ANTIBODIES, TRADITIONAL SCREENING AND DIAGNOSIS ALGORITHM: RPR Ser Ql: NONREACTIVE

## 2019-02-19 MED ORDER — GABAPENTIN 300 MG PO CAPS
900.0000 mg | ORAL_CAPSULE | Freq: Every day | ORAL | Status: DC
  Administered 2019-02-19: 900 mg via ORAL
  Filled 2019-02-19 (×2): qty 3

## 2019-02-19 MED ORDER — FENTANYL CITRATE (PF) 100 MCG/2ML IJ SOLN
INTRAMUSCULAR | Status: AC
  Filled 2019-02-19: qty 2

## 2019-02-19 MED ORDER — FENTANYL CITRATE (PF) 100 MCG/2ML IJ SOLN
INTRAMUSCULAR | Status: DC | PRN
  Administered 2019-02-19: 50 ug via INTRAVENOUS
  Administered 2019-02-19: 15 ug via INTRATHECAL
  Administered 2019-02-19 (×3): 50 ug via INTRAVENOUS

## 2019-02-19 MED ORDER — ONDANSETRON HCL 4 MG/2ML IJ SOLN
INTRAMUSCULAR | Status: DC | PRN
  Administered 2019-02-19: 4 mg via INTRAVENOUS

## 2019-02-19 MED ORDER — PRENATAL MULTIVITAMIN CH
1.0000 | ORAL_TABLET | Freq: Every day | ORAL | Status: DC
  Administered 2019-02-19 – 2019-02-20 (×2): 1 via ORAL
  Filled 2019-02-19 (×2): qty 1

## 2019-02-19 MED ORDER — SODIUM CHLORIDE 0.9% FLUSH: 3.0000 mL | INTRAVENOUS | Status: DC | PRN

## 2019-02-19 MED ORDER — DEXAMETHASONE SODIUM PHOSPHATE 4 MG/ML IJ SOLN
INTRAMUSCULAR | Status: DC | PRN
  Administered 2019-02-19: 10 mg via INTRAVENOUS

## 2019-02-19 MED ORDER — OXYTOCIN 40 UNITS IN NORMAL SALINE INFUSION - SIMPLE MED
INTRAVENOUS | Status: DC | PRN
  Administered 2019-02-19: 1000 mL via INTRAVENOUS

## 2019-02-19 MED ORDER — DEXAMETHASONE SODIUM PHOSPHATE 10 MG/ML IJ SOLN
INTRAMUSCULAR | Status: AC
  Filled 2019-02-19: qty 1

## 2019-02-19 MED ORDER — KETAMINE HCL 50 MG/ML IJ SOLN
INTRAMUSCULAR | Status: AC
  Filled 2019-02-19: qty 10

## 2019-02-19 MED ORDER — KETAMINE HCL 50 MG/ML IJ SOLN
INTRAMUSCULAR | Status: DC | PRN
  Administered 2019-02-19: 50 mg via INTRAMUSCULAR

## 2019-02-19 MED ORDER — MORPHINE SULFATE (PF) 2 MG/ML IV SOLN: 1.0000 mg | INTRAVENOUS | Status: DC | PRN

## 2019-02-19 MED ORDER — SODIUM CHLORIDE 0.9 % IV SOLN
INTRAVENOUS | Status: DC | PRN
  Administered 2019-02-19: 50 ug/min via INTRAVENOUS

## 2019-02-19 MED ORDER — COCONUT OIL OIL
1.0000 "application " | TOPICAL_OIL | Status: DC | PRN
  Filled 2019-02-19: qty 120

## 2019-02-19 MED ORDER — LACTATED RINGERS IV SOLN: INTRAVENOUS | Status: DC

## 2019-02-19 MED ORDER — ONDANSETRON HCL 4 MG/2ML IJ SOLN: 4.0000 mg | Freq: Once | INTRAMUSCULAR | Status: DC | PRN

## 2019-02-19 MED ORDER — SODIUM CHLORIDE 0.9 % IV SOLN: 250.0000 mL | INTRAVENOUS | Status: DC

## 2019-02-19 MED ORDER — ACETAMINOPHEN 500 MG PO TABS
1000.0000 mg | ORAL_TABLET | Freq: Four times a day (QID) | ORAL | Status: DC
  Administered 2019-02-19 – 2019-02-20 (×4): 1000 mg via ORAL
  Filled 2019-02-19 (×4): qty 2

## 2019-02-19 MED ORDER — WITCH HAZEL-GLYCERIN EX PADS: 1.0000 "application " | MEDICATED_PAD | CUTANEOUS | Status: DC | PRN

## 2019-02-19 MED ORDER — SODIUM CHLORIDE 0.9% FLUSH: 3.0000 mL | Freq: Two times a day (BID) | INTRAVENOUS | Status: DC

## 2019-02-19 MED ORDER — ONDANSETRON HCL 4 MG/2ML IJ SOLN
INTRAMUSCULAR | Status: AC
  Filled 2019-02-19: qty 2

## 2019-02-19 MED ORDER — ENOXAPARIN SODIUM 30 MG/0.3ML ~~LOC~~ SOLN
30.0000 mg | Freq: Two times a day (BID) | SUBCUTANEOUS | Status: DC
  Filled 2019-02-19 (×2): qty 0.3

## 2019-02-19 MED ORDER — FENTANYL CITRATE (PF) 100 MCG/2ML IJ SOLN: 25.0000 ug | INTRAMUSCULAR | Status: DC | PRN

## 2019-02-19 MED ORDER — MORPHINE SULFATE (PF) 0.5 MG/ML IJ SOLN
INTRAMUSCULAR | Status: DC | PRN
  Administered 2019-02-19: .1 mg via INTRATHECAL

## 2019-02-19 MED ORDER — PHENYLEPHRINE HCL (PRESSORS) 10 MG/ML IV SOLN
INTRAVENOUS | Status: DC | PRN
  Administered 2019-02-19: 100 ug via INTRAVENOUS

## 2019-02-19 MED ORDER — SIMETHICONE 80 MG PO CHEW
160.0000 mg | CHEWABLE_TABLET | Freq: Four times a day (QID) | ORAL | Status: DC | PRN
  Administered 2019-02-19 – 2019-02-20 (×2): 160 mg via ORAL
  Filled 2019-02-19 (×2): qty 2

## 2019-02-19 MED ORDER — DIPHENHYDRAMINE HCL 25 MG PO CAPS: 25.0000 mg | ORAL_CAPSULE | Freq: Four times a day (QID) | ORAL | Status: DC | PRN

## 2019-02-19 MED ORDER — OXYTOCIN 40 UNITS IN NORMAL SALINE INFUSION - SIMPLE MED
2.5000 [IU]/h | INTRAVENOUS | Status: DC
  Filled 2019-02-19: qty 1000

## 2019-02-19 MED ORDER — SODIUM CHLORIDE 0.9 % IV SOLN
INTRAVENOUS | Status: DC | PRN
  Administered 2019-02-19: 70 mL

## 2019-02-19 MED ORDER — BUPIVACAINE IN DEXTROSE 0.75-8.25 % IT SOLN
INTRATHECAL | Status: DC | PRN
  Administered 2019-02-19: 1.725 mL via INTRATHECAL

## 2019-02-19 MED ORDER — NALBUPHINE HCL 10 MG/ML IJ SOLN: 2.5000 mg | Freq: Four times a day (QID) | INTRAMUSCULAR | Status: DC | PRN

## 2019-02-19 MED ORDER — IBUPROFEN 800 MG PO TABS
800.0000 mg | ORAL_TABLET | Freq: Three times a day (TID) | ORAL | Status: DC
  Administered 2019-02-19 – 2019-02-20 (×5): 800 mg via ORAL
  Filled 2019-02-19 (×5): qty 1

## 2019-02-19 MED ORDER — MIDAZOLAM HCL 2 MG/2ML IJ SOLN
INTRAMUSCULAR | Status: AC
  Filled 2019-02-19: qty 2

## 2019-02-19 MED ORDER — KETOROLAC TROMETHAMINE 15 MG/ML IJ SOLN: 15.0000 mg | Freq: Three times a day (TID) | INTRAMUSCULAR | Status: DC | PRN

## 2019-02-19 MED ORDER — KETOROLAC TROMETHAMINE 30 MG/ML IJ SOLN: 15.0000 mg | Freq: Once | INTRAMUSCULAR | Status: DC

## 2019-02-19 MED ORDER — OXYCODONE HCL 5 MG PO TABS: 5.0000 mg | ORAL_TABLET | ORAL | Status: DC | PRN

## 2019-02-19 MED ORDER — MENTHOL 3 MG MT LOZG
1.0000 | LOZENGE | OROMUCOSAL | Status: DC | PRN
  Filled 2019-02-19: qty 9

## 2019-02-19 MED ORDER — KETOROLAC TROMETHAMINE 30 MG/ML IJ SOLN
INTRAMUSCULAR | Status: DC | PRN
  Administered 2019-02-19: 30 mg via INTRAVENOUS

## 2019-02-19 MED ORDER — DIBUCAINE (PERIANAL) 1 % EX OINT: 1.0000 "application " | TOPICAL_OINTMENT | CUTANEOUS | Status: DC | PRN

## 2019-02-19 MED ORDER — BUPIVACAINE HCL (PF) 0.5 % IJ SOLN
INTRAMUSCULAR | Status: DC | PRN
  Administered 2019-02-19: 30 mL

## 2019-02-19 MED ORDER — SENNOSIDES-DOCUSATE SODIUM 8.6-50 MG PO TABS
2.0000 | ORAL_TABLET | ORAL | Status: DC
  Administered 2019-02-20: 2 via ORAL
  Filled 2019-02-19: qty 2

## 2019-02-19 MED ORDER — CARBOPROST TROMETHAMINE 250 MCG/ML IM SOLN
INTRAMUSCULAR | Status: AC
  Filled 2019-02-19: qty 1

## 2019-02-19 MED ORDER — MIDAZOLAM HCL 5 MG/5ML IJ SOLN
INTRAMUSCULAR | Status: DC | PRN
  Administered 2019-02-19: 2 mg via INTRAVENOUS

## 2019-02-19 NOTE — Anesthesia Post-op Follow-up Note (Signed)
Anesthesia QCDR form completed.        

## 2019-02-19 NOTE — Lactation Note (Signed)
This note was copied from a baby's chart. Lactation Consultation Note  Patient Name: Paula Padilla [id number] Date: 02/19/2019 Reason for consult: Initial assessment;Mother's request;Difficult latch;Early term 37-38.6wks;Infant < 6lbs;Other (Comment)(Hypoglycemia)  Assisted mom with breast feeding with pillow support sitting up in chair in football hold.  [name] had difficulty maintaining her blood sugars and temperature.  Mom has been unable to get [facility] breast feeding on her own so has just been giving bottles of formula and pumping with her own Spectra for stimulation.  Mom has large nipples and [name] has a small mouth.  Assisted mom with latching [name] with #24 nipple shield.  [name] would maintain latch and continue to suck with small amount of formula in tip of nipple shield.  Massaged breast and hand expressed a few drops of colostrum.  Then she continued with nutritive sucking by placing curved tip syringe along side of nipple shield and giving her drop of mom's colostrum and 5 ml of formula.  Reviewed supply and demand, normal course of lactation and routine newborn feeding patterns.  Lactation name and number is written on white board and encouraged to call with any questions, concerns or assistance. Maternal Data Formula Feeding for Exclusion: No Has patient been taught Hand Expression?: Yes(Demonstrated hand expression) Does the patient have breastfeeding experience prior to this delivery?: Yes  Feeding Feeding Type: Breast Fed  LATCH Score Latch: Repeated attempts needed to sustain latch, nipple held in mouth throughout feeding, stimulation needed to elicit sucking reflex.  Audible Swallowing: A few with stimulation  Type of Nipple: Everted at rest and after stimulation(Large nipples)  Comfort (Breast/Nipple): Filling, red/small blisters or bruises, mild/mod discomfort  Hold (Positioning): Assistance needed to correctly position infant at breast and maintain  latch.  LATCH Score: 6  Interventions Interventions: Breast feeding basics reviewed;Assisted with latch;Skin to skin;Breast massage;Hand express;Pre-pump if needed;Reverse pressure;Breast compression;Adjust position;Support pillows;Position options;Expressed milk;Coconut oil;DEBP  Lactation Tools Discussed/Used Tools: Nipple [place] Fuel;Other (comment)(Curved tip syringe) Nipple shield size: 24 WIC Program: No([employer]) Pump Review: Setup, frequency, and cleaning;Milk Storage;Other (comment) Initiated by:: Mom started pumping on her own with own Spectra pump Date initiated:: 02/19/19   Consult Status Consult Status: Follow-up Follow-up type: Call as needed    Jarold Motto 02/19/2019, 8:18 PM

## 2019-02-19 NOTE — Anesthesia Postprocedure Evaluation (Signed)
Anesthesia Post Note  Patient: Paula Padilla  Procedure(s) Performed: CESAREAN SECTION WITH BILATERAL TUBAL LIGATION (Bilateral Abdomen)  Patient location during evaluation: Mother Baby Anesthesia Type: Epidural Level of consciousness: awake and alert Pain management: pain level controlled Vital Signs Assessment: post-procedure vital signs reviewed and stable Respiratory status: spontaneous breathing, nonlabored ventilation and respiratory function stable Cardiovascular status: stable Postop Assessment: no headache, no backache and epidural receding Anesthetic complications: no     Last Vitals:  Vitals:   02/19/19 0749 02/19/19 0847  BP: (!) 99/51 (!) 102/59  Pulse: 89 90  Resp: 18 20  Temp: 37.3 C 37.1 C  SpO2: 98% 97%    Last Pain:  Vitals:   02/19/19 0847  TempSrc: Oral  PainSc:                  Molli Barrows

## 2019-02-19 NOTE — Op Note (Addendum)
Cesarean Section Procedure Note  02/19/2019   Patient:  Paula Padilla  33 y.o. female at [redacted]w[redacted]d.  Patient's last menstrual period was 06/04/2018. Preoperative diagnosis:  hand presentation, morbid obesity, gestational hypertension, desired permanent sterlization Postoperative diagnosis:  hand presentation, morbid obesity, gestational hypertension, small for gestational age, abnormal left adnexal anatomy, desired permanent sterilization, intraoperative hemorrhage.   PROCEDURE:  Procedure(s): CESAREAN SECTION WITH BILATERAL TUBAL LIGATION (Bilateral) Surgeon:  Surgeon(s) and Role:    * Teresina Bugaj, Honor Loh, MD - Primary Hassan Buckler, CNM - Assist Anesthesia:  spinal I/O: Total I/O In: 2446.3 [I.V.:2398.3; IV Piggyback:48] Out: 2265 [Urine:1050; Blood:1215] Specimens:  Cord Blood, placenta Complications: None Apparent Disposition:  VS stable to PACU  Findings:  1. Uterus and low uterine segment traveled deep, inferior to pubic bone. The transverse incision made at the level of the fetal head was actually mid-uterus. 2. Right tube, ovary and round ligament were normal  3. Left cornua not identified.  A single non-vascular dilated tubular structure attached from the ovary to the uterus; there was a 1cm cystic nodule at the connection of this structure to the ovary.  No round ligament or fallopian tube was identified connecting to the uterus on the left.  A 7cm x3cm left ovary was identified laterally and superiorly, fixed to the lateral peritoneum, and at its rostral end, a fimbriated end of a tube was identified, but no other portion of the tube was visualized or palpated.  The retroperitoneum was not explored.   4. Live born female "Paula Padilla"  Birth Weight: 4 lb 10.1 oz (2100 g)  APGAR: 8, 9, small for gestational age.  LEFT hand presenting. Double body cord. 5. Placenta was 300g (weighed), irregular in shape, thin, shoddy tissue, without defined disc borders. Diffuse maternal surface  calcifications, a significant area of infarct (20%?) at the central superior border, 4cm x 4cm. A 5cm x 8cm succenturiate lobe with bridging vessels.  Fetal surface vessels thin and scant in number.  Thin and hypocoiled umbilical cord  Newborn Delivery   Birth date/time:  02/19/2019 00:42:00 Delivery type:  C-Section, Low Transverse C-section categorization:  Primary      Indication for procedure: 33 y.o. female at [redacted]w[redacted]d who was scheduled for IOL for gestational hypertension.  She had cervical ripening with balloon cathter, cytotec, pitocin.  She had a 39min deceleration after large-volume AROM, and IUPC and FSE were placed.  At subsequent check, a hand and forearm were palpated in the cervix, in front of the fetal head.  Attempts to reduce the hand were unsuccessful.  Risk of permanent injury vs cesarean were weighed and patient elected for cesarean, and requested that her tubes be ligated at time of surgery for permanent sterilization.   Procedure Details   The risks, benefits, complications, treatment options, and expected outcomes were discussed with the patient. Informed consent was obtained. The patient was taken to Operating Room, identified as Paula Padilla and the procedure verified as a cesarean delivery with tubal ligation. Her epidural catheter was removed and a spinal was placed.    After administration of anesthesia, the patient was prepped and draped in the usual sterile manner, including a vaginal prep and Traxi pannus retractor. A surgical time out was performed, with the pediatric team present. After confirming adequate anesthesia, a Pfannenstiel incision was made and carried down through the subcutaneous tissue to the fascia. There was significant bleeding from the vessels in the subcutaneous tissues, which were cauterized.  Fascial incision was made and extended  transversely, and there was bleeding in this layer as well. The fascia was atraumatically separated from the underlying  rectus tissue superiorly and inferiorly. The rectus muscles were divided in the midline. The peritoneum was identified and entered. Peritoneal incision was extended longitudinally. An Alexis retractor was placed.  A  transverse uterine incision was made. Delivered from cephalic presentation, with left arm extended into the cervix, was a live born fenale. Delayed cord clamping was performed for 60 seconds, during which time we sang happy birthday to baby Paula Padilla. The umbilical cord was doubly clamped and cut, and the baby was handed off to the awaitng pediatrician.  Cord blood was obtained for evaluation. The placenta was removed intact and appeared abnormal, as described above. It was sent to pathology. The uterus was delivered from the abdominal cavity and cleared of clots, membranes, and debris. The uterine incision was closed with running locking sutures of 0 Vicryl, and then a second, imbricating stitch was placed. Hemostasis was difficult to achieve on the right apex.  Several layers of suture in various orientations were placed, and each puncture of the needle caused further bleeding. Sponges were packed against the uterine incision, while the attention was turned to the right fallopian tube. Patient again confirmed desire for tubal.  The tube was grasped with a babcock and traced to the fimbriated end.  Kelly clamps were placed in the mesosalpinx, the tube was ligated, and 2-0 vicryl was used to the proximal and distal ends of the remaining tissue. The attention was turned to the left cornua, but none was identified, as described above. The fimbriated end of the tube was seen at the rostral end of the oblong ovary, and a kelly clamp was placed across the tissue, it was transected,  suture tied for hemostasis. The abdominal cavity was evacuated of extraneous fluid. The uterus was returned to the abdominal cavity and again the incision was inspected for hemostasis, but blood continuously was welling up on the  right side.  The incision was intact and hemostatic. The right fallopian tube sites were hemostatic. After meticulous inspection, the posterior side of the venus plexus within the adnexa/broad ligament was interrupted and hemorrhaging.  An Alis clamp was placed over the breached tissue and a figure of eight of 2-0 vicryl was placed.  This secured the tissue and hemostasis was observed.  The paracolic gutters were cleaned and the pelvis was irrigated. The muscles were hemostatic, but there was bleeding from the superior apex of the fascia/muscle interface, and a figure of eight of 2-0 vicryl was placed and hemostasis was achieved.  The fascia was then reapproximated with running suture of 0-vicryl. 90cc of Long- and short-acting bupivicaine was injected circumferentially into the fascia.  After a change of gloves, the subcutaneous tissue was irrigated, cauterized and reapproximated with 3-0 vicryl. The skin was closed with absorbable staples and 10cc of long- and short-acting bupivacaine injected into the skin and subcutaneous tissues.  The incision was covered with surgical glue. An abdominal binder was placed once the patient was transferred to the hospital bed.    Instrument, sponge, and needle counts were correct prior the abdominal closure and at the conclusion of the case.   I was present and performed this procedure in its entirety.  VTE: SCDs Perioperative antibiotics: Ancef 2g, Azithromycin 500mg   ----- Larey Days, MD Attending Obstetrician and Gynecologist [facility], Department of [facility]

## 2019-02-19 NOTE — Anesthesia Procedure Notes (Signed)
Spinal  Start time: 02/19/2019 12:05 AM End time: 02/19/2019 12:07 AM Staffing Anesthesiologist: Alvin Critchley, MD Resident/CRNA: Aline Brochure, CRNA Performed: resident/CRNA  Preanesthetic Checklist Completed: patient identified, site marked, surgical consent, pre-op evaluation, IV checked, risks and benefits discussed and monitors and equipment checked Spinal Block Patient position: sitting Prep: ChloraPrep and site prepped and draped Patient monitoring: heart rate, continuous pulse ox and blood pressure Approach: midline Location: L3-4 Injection technique: single-shot Needle Needle type: Pencan  Needle gauge: 24 G

## 2019-02-19 NOTE — Progress Notes (Signed)
Patient ID: [name], female   DOB: 07-29-1986, 33 y.o.   MRN: [id number] 5 hours post op . Nurse in room . Pt without c/o  She appears a little pale .  CBC ok post op .  VSS recheck CBC tomorrow

## 2019-02-19 NOTE — Discharge Summary (Signed)
Obstetrical Discharge Summary  Patient Name: Paula Padilla DOB: 05/11/1986 MRN: [id number]  Date of Admission: 02/18/2019 Date of Delivery: 02/19/19 Delivered by: Dr Vikki Ports Ward Date of Discharge: 02/20/2019  Primary OB: [place]  [id number] last menstrual period was 06/04/2018. EDC Estimated Date of Delivery: 03/11/19 Gestational Age at Delivery: [redacted]w[redacted]d   Antepartum complications:  1. GHTN this preg, Pre-e x 2 previous. 2. Obesity, BMI 40.4 3. Hx Thyroid nodules, normal thyroid labs 4. Bulging cervical discs  Admitting Diagnosis: GHTN at 37wks Secondary Diagnosis: fetal hand presentation, cesarean delivery  Patient Active Problem List   Diagnosis Date Noted  . Gestational hypertension w/o significant proteinuria in 3rd trimester 02/18/2019  . Labor and delivery indication for care or intervention 01/31/2019  . Elevated BP without diagnosis of hypertension 01/18/2019  . First trimester screening     Augmentation: AROM, Pitocin, Cytotec and Cook Catheter Complications: [id number] Intrapartum complications/course: IOL for GHTN with cytotec and Cook cath, variable decels developed and inability to externally trace UCs. AROM performed after [facility] cath out, FHR decel x 62min with recovery after interventions, IUPC and FSE at that time. Approx 2hrs later, hand presentation noted and Dr Leonides Schanz notified, consented for CS.  Date of Delivery: 02/19/19 Delivered By: Larey Days MD Delivery Type: primary cesarean section, low transverse incision Anesthesia: epidural, spinal for CS Placenta: expressed Laceration: none Episiotomy: none Newborn Data: Live born female "[place]" Birth Weight: 4 lb 10.1 oz (2100 g) APGAR: 8, 9  Newborn Delivery   Birth date/time:  02/19/2019 00:42:00 Delivery type:  C-Section, Low Transverse C-section categorization:  Primary       Postpartum Procedures:none Post partum course: Cesarean Section  Patient had an uncomplicated  postpartum course. Drop in hemoglobin , but clinically pt was stable and asymptomatic  .  By time of discharge on POD#1, her pain was controlled on oral pain medications; she had appropriate lochia and was ambulating, voiding without difficulty, tolerating regular diet and passing flatus.   She was deemed stable for discharge to home.    Discharge Physical Exam: 02/20/2019  BP 104/64 (BP Location: Right Arm)   Pulse 86   Temp 97.7 F (36.5 C) (Oral)   Resp 20   Ht 5\' 7"  (1.702 m)   Wt 126.6 kg   LMP 06/04/2018   SpO2 98% Comment: room air  Breastfeeding Unknown   BMI 43.70 kg/m   General: NAD CV: RRR Pulm: CTABL, nl effort ABD: s/nd/nt, fundus firm and below the umbilicus Lochia: moderate Incision: c/d/i, healing well, no significant drainage, no dehiscence, no significant erythema  DVT Evaluation: LE non-ttp, no evidence of DVT on exam.  Hemoglobin  Date Value Ref Range Status  02/20/2019 7.9 (L) 12.0 - 15.0 g/dL Final   HGB  Date Value Ref Range Status  12/03/2013 13.7 12.0 - 16.0 g/dL Final   HCT  Date Value Ref Range Status  02/20/2019 24.4 (L) 36.0 - 46.0 % Final  12/03/2013 41.9 35.0 - 47.0 % Final   Results for orders placed or performed during the hospital encounter of 02/18/19 (from the past 72 hour(s))  SARS Coronavirus 2 (CEPHEID - Performed in [place] hospital lab), Hosp Order     Status: None   Collection Time: 02/18/19 10:28 AM  Result Value Ref Range   SARS Coronavirus 2 NEGATIVE NEGATIVE    Comment: (NOTE) If result is NEGATIVE SARS-CoV-2 target nucleic acids are NOT DETECTED. The SARS-CoV-2 RNA is generally detectable in upper and lower  respiratory specimens during  the acute phase of infection. The lowest  concentration of SARS-CoV-2 viral copies this assay can detect is 250  copies / mL. A negative result does not preclude SARS-CoV-2 infection  and should not be used as the sole basis for treatment or other  patient management decisions.  A  negative result may occur with  improper specimen collection / handling, submission of specimen other  than nasopharyngeal swab, presence of viral mutation(s) within the  areas targeted by this assay, and inadequate number of viral copies  (<250 copies / mL). A negative result must be combined with clinical  observations, patient history, and epidemiological information. If result is POSITIVE SARS-CoV-2 target nucleic acids are DETECTED. The SARS-CoV-2 RNA is generally detectable in upper and lower  respiratory specimens dur ing the acute phase of infection.  Positive  results are indicative of active infection with SARS-CoV-2.  Clinical  correlation with patient history and other diagnostic information is  necessary to determine patient infection status.  Positive results do  not rule out bacterial infection or co-infection with other viruses. If result is PRESUMPTIVE POSTIVE SARS-CoV-2 nucleic acids MAY BE PRESENT.   A presumptive positive result was obtained on the submitted specimen  and confirmed on repeat testing.  While 2019 novel coronavirus  (SARS-CoV-2) nucleic acids may be present in the submitted sample  additional confirmatory testing may be necessary for epidemiological  and / or clinical management purposes  to differentiate between  SARS-CoV-2 and other Sarbecovirus currently known to infect humans.  If clinically indicated additional testing with an alternate test  methodology [phone]) is advised. The SARS-CoV-2 RNA is generally  detectable in upper and lower respiratory sp ecimens during the acute  phase of infection. The expected result is Negative. Fact Sheet for Patients:  StrictlyIdeas.no Fact Sheet for Healthcare Providers: BankingDealers.co.za This test is not yet approved or cleared by the [place] FDA and has been authorized for detection and/or diagnosis of SARS-CoV-2 by FDA under an Emergency Use  Authorization (EUA).  This EUA will remain in effect (meaning this test can be used) for the duration of the COVID-19 declaration under Section 564(b)(1) of the Act, 21 U.S.C. section 360bbb-3(b)(1), unless the authorization is terminated or revoked sooner. Performed at [facility], [address] [place], [place] [address]   Protein / creatinine ratio, urine     Status: None   Collection Time: 02/18/19 11:18 AM  Result Value Ref Range   Creatinine, Urine 82 mg/dL   Total Protein, Urine <6 mg/dL    Comment: NO NORMAL RANGE ESTABLISHED FOR THIS TEST   Protein Creatinine Ratio        0.00 - 0.15 mg/mg[Cre]    Comment: RESULT BELOW REPORTABLE RANGE, UNABLE TO CALCULATE. Performed at [facility], [address] [place], [place] [address]   CBC     Status: Abnormal   Collection Time: 02/18/19 11:21 AM  Result Value Ref Range   WBC 7.4 4.0 - 10.5 K/uL   RBC 3.26 (L) 3.87 - 5.11 MIL/uL   Hemoglobin 10.4 (L) 12.0 - 15.0 g/dL   HCT 30.6 (L) 36.0 - 46.0 %   MCV 93.9 80.0 - 100.0 fL   MCH 31.9 26.0 - 34.0 pg   MCHC 34.0 30.0 - 36.0 g/dL   RDW 13.0 11.5 - 15.5 %   Platelets 233 150 - 400 K/uL   nRBC 0.0 0.0 - 0.2 %    Comment: Performed at [facility], [address] [place],  [place] [address]  Comprehensive metabolic panel     Status: Abnormal   Collection Time: 02/18/19 11:21 AM  Result Value Ref Range   Sodium 134 (L) 135 - 145 mmol/L   Potassium 3.6 3.5 - 5.1 mmol/L   Chloride 106 98 - 111 mmol/L   CO2 19 (L) 22 - 32 mmol/L   Glucose, Bld 86 70 - 99 mg/dL   BUN 6 6 - 20 mg/dL   Creatinine, Ser 0.69 0.44 - 1.00 mg/dL   Calcium 8.4 (L) 8.9 - 10.3 mg/dL   Total Protein 6.5 6.5 - 8.1 g/dL   Albumin 2.9 (L) 3.5 - 5.0 g/dL   AST 11 (L) 15 - 41 U/L   ALT 10 0 - 44 U/L   Alkaline Phosphatase 81 38 - 126 U/L   Total Bilirubin 0.5 0.3 - 1.2 mg/dL   GFR calc non Af Amer >60 >60 mL/min   GFR calc Af Amer >60 >60 mL/min   Anion gap 9 5 - 15     Comment: Performed at [facility], [address] [place], [place] [address]  RPR     Status: None   Collection Time: 02/18/19 11:21 AM  Result Value Ref Range   RPR Ser Ql Non Reactive Non Reactive    Comment: (NOTE) Performed At: [facility] [place], [place] [id number] Rush Farmer MD [id number]   Type and screen     Status: None   Collection Time: 02/18/19 11:21 AM  Result Value Ref Range   ABO/RH(D) O POS    Antibody Screen NEG    Sample Expiration      02/21/2019,2359 Performed at [facility], [address] [place], [place] [address]   CBC     Status: Abnormal   Collection Time: 02/19/19  6:05 AM  Result Value Ref Range   WBC 11.4 (H) 4.0 - 10.5 K/uL   RBC 3.11 (L) 3.87 - 5.11 MIL/uL   Hemoglobin 9.7 (L) 12.0 - 15.0 g/dL   HCT 29.4 (L) 36.0 - 46.0 %   MCV 94.5 80.0 - 100.0 fL   MCH 31.2 26.0 - 34.0 pg   MCHC 33.0 30.0 - 36.0 g/dL   RDW 13.0 11.5 - 15.5 %   Platelets 224 150 - 400 K/uL   nRBC 0.0 0.0 - 0.2 %    Comment: Performed at [facility], [address] [place], [place] [address]  CBC     Status: Abnormal   Collection Time: 02/20/19  5:46 AM  Result Value Ref Range   WBC 5.8 4.0 - 10.5 K/uL   RBC 2.52 (L) 3.87 - 5.11 MIL/uL   Hemoglobin 7.9 (L) 12.0 - 15.0 g/dL   HCT 24.4 (L) 36.0 - 46.0 %   MCV 96.8 80.0 - 100.0 fL   MCH 31.3 26.0 - 34.0 pg   MCHC 32.4 30.0 - 36.0 g/dL   RDW 13.3 11.5 - 15.5 %   Platelets 178 150 - 400 K/uL   nRBC 0.0 0.0 - 0.2 %    Comment: Performed at [facility], [address] [place], [place] [address]    Disposition: stable, discharge to home. Baby Feeding: breastmilk Baby Disposition: home with mom  Rh Immune globulin given: n/a Rubella vaccine given: n/a Varicella vaccine given: n/a Tdap vaccine given in AP setting: 12/30/18 Flu vaccine given in AP setting: rec'd at work  Contraception: BTL  Prenatal Labs:  Blood type/Rh  O pos  Antibody  screen  neg  Rubella Immune  Varicella Immune  RPR NR  HBsAg Neg  HIV NR  GC neg  Chlamydia neg  Genetic screening negative  1 hour GTT  102  GBS  negative     Plan:  Paula Padilla was discharged to home in good condition. Follow-up appointment with delivering provider in 2 weeks.  Discharge Medications: Allergies as of 02/20/2019      Reactions   Peanut Oil Swelling   Throat swelling   Corn Oil Itching, Rash   Throat swelling   Wheat Bran Itching, Rash   Throat swelling      Medication List    STOP taking these medications   aspirin 81 MG chewable tablet   metoCLOPramide 10 MG tablet Commonly known as:  REGLAN   promethazine 25 MG suppository Commonly known as:  PHENERGAN     TAKE these medications   albuterol 108 (90 Base) MCG/ACT inhaler Commonly known as:  VENTOLIN HFA Inhale 2 puffs into the lungs every 6 (six) hours as needed for wheezing or shortness of breath.   Azelastine-Fluticasone 137-50 MCG/ACT Susp Place 2 sprays into the nose daily.   ferrous sulfate 325 (65 FE) MG EC tablet Take 1 tablet (325 mg total) by mouth 2 (two) times daily.   gabapentin 300 MG capsule Commonly known as:  NEURONTIN Take 3 capsules (900 mg total) by mouth at bedtime for 7 days.   ibuprofen 800 MG tablet Commonly known as:  ADVIL Take 1 tablet (800 mg total) by mouth every 8 (eight) hours as needed.   oxyCODONE 5 MG immediate release tablet Commonly known as:  Oxy IR/ROXICODONE Take 1-2 tablets (5-10 mg total) by mouth every 6 (six) hours as needed for moderate pain.   pantoprazole 40 MG tablet Commonly known as:  PROTONIX Take 40 mg by mouth 2 (two) times daily.   prenatal multivitamin Tabs tablet Take 1 tablet by mouth daily at 12 noon.       Follow-up Information    Ward, Honor Loh, MD Follow up in 2 week(s).   Specialty:  Obstetrics and Gynecology Why:  make 2 wk Post-op video visit; 6wk routine PP visit Contact information: [address] [place] [address] [phone]           Signed:  Boykin Nearing, MD 02/20/2019  2:38 PM   Hit refresh and delete this line

## 2019-02-19 NOTE — Transfer of Care (Signed)
Immediate Anesthesia Transfer of Care Note  Patient: Paula Padilla  Procedure(s) Performed: CESAREAN SECTION WITH BILATERAL TUBAL LIGATION (Bilateral Abdomen)  Patient Location: PACU  Anesthesia Type:Spinal  Level of Consciousness: awake  Airway & Oxygen Therapy: Patient Spontanous Breathing  Post-op Assessment: Post -op Vital signs reviewed and stable  Post vital signs: stable  Last Vitals:  Vitals Value Taken Time  BP 112/72 02/19/2019  2:55 AM  Temp    Pulse 85 02/19/2019  2:55 AM  Resp 12 02/19/2019  2:55 AM  SpO2 97 % 02/19/2019  2:55 AM    Last Pain:  Vitals:   02/18/19 2255  TempSrc: Oral  PainSc:       Patients Stated Pain Goal: 0 (74/09/92 7800)  Complications: No apparent anesthesia complications

## 2019-02-20 ENCOUNTER — Encounter: Payer: Self-pay | Admitting: Obstetrics & Gynecology

## 2019-02-20 LAB — CBC
HCT: 24.4 % — ABNORMAL LOW (ref 36.0–46.0)
Hemoglobin: 7.9 g/dL — ABNORMAL LOW (ref 12.0–15.0)
MCH: 31.3 pg (ref 26.0–34.0)
MCHC: 32.4 g/dL (ref 30.0–36.0)
MCV: 96.8 fL (ref 80.0–100.0)
Platelets: 178 10*3/uL (ref 150–400)
RBC: 2.52 MIL/uL — ABNORMAL LOW (ref 3.87–5.11)
RDW: 13.3 % (ref 11.5–15.5)
WBC: 5.8 10*3/uL (ref 4.0–10.5)
nRBC: 0 % (ref 0.0–0.2)

## 2019-02-20 MED ORDER — ACETAMINOPHEN 500 MG PO TABS
1000.0000 mg | ORAL_TABLET | Freq: Four times a day (QID) | ORAL | Status: DC
  Administered 2019-02-20 (×2): 1000 mg via ORAL
  Filled 2019-02-20 (×2): qty 2

## 2019-02-20 MED ORDER — IBUPROFEN 800 MG PO TABS: 800.0000 mg | ORAL_TABLET | Freq: Three times a day (TID) | ORAL | 0 refills | Status: AC | PRN

## 2019-02-20 MED ORDER — FERROUS SULFATE 325 (65 FE) MG PO TBEC: 325.0000 mg | DELAYED_RELEASE_TABLET | Freq: Two times a day (BID) | ORAL | 0 refills | Status: AC

## 2019-02-20 MED ORDER — GABAPENTIN 300 MG PO CAPS: 900.0000 mg | ORAL_CAPSULE | Freq: Every day | ORAL | 0 refills | Status: AC

## 2019-02-20 MED ORDER — OXYCODONE HCL 5 MG PO TABS: 5.0000 mg | ORAL_TABLET | Freq: Four times a day (QID) | ORAL | 0 refills | Status: AC | PRN

## 2019-02-20 NOTE — Progress Notes (Signed)
Reviewed D/C instructions with pt and family. Pt verbalized understanding of teaching. Discharged to home via W/C. Pt to schedule f/u appt.  

## 2019-02-23 LAB — SURGICAL PATHOLOGY

## 2019-03-04 ENCOUNTER — Telehealth: Payer: Self-pay | Admitting: Lactation Services

## 2019-03-04 NOTE — Telephone Encounter (Signed)
Referral sent from [facility] for lact assistance, called mom who stated baby was 2wks old, baby latching but mom supplementing after feedings with formula, SGA baby,  only able to pump 15 cc ebm after feeding or instead of breastfeeding, had low milk supply with other 2 children, no hx of breast surgery, has been using Nabel nourish drops?, combo fenugreek, blessed thistle, etc, no increase in supply, do I have any suggestions,  Reglan is a possiblity to increase production if no depression hx, she will contact her midwife for prescription.  , Pt reassured but given her history of low supply, this may be all she can do, especially since baby is 2 wks old, call for questions or assistance

## 2019-07-26 ENCOUNTER — Other Ambulatory Visit: Payer: Self-pay | Admitting: Internal Medicine

## 2019-07-26 DIAGNOSIS — E0590 Thyrotoxicosis, unspecified without thyrotoxic crisis or storm: Secondary | ICD-10-CM

## 2019-08-04 ENCOUNTER — Encounter
Admission: RE | Admit: 2019-08-04 | Discharge: 2019-08-04 | Disposition: A | Discharge status: Home / Self Care | Payer: Managed Care, Other (non HMO) | Source: Ambulatory Visit | Attending: Internal Medicine | Admitting: Internal Medicine

## 2019-08-04 ENCOUNTER — Other Ambulatory Visit: Payer: Self-pay

## 2019-08-04 DIAGNOSIS — E0590 Thyrotoxicosis, unspecified without thyrotoxic crisis or storm: Secondary | ICD-10-CM | POA: Insufficient documentation

## 2019-08-04 MED ORDER — SODIUM IODIDE I-123 7.4 MBQ CAPS
413.0000 | ORAL_CAPSULE | Freq: Once | ORAL | Status: AC
  Administered 2019-08-04: 413 via ORAL

## 2019-08-05 ENCOUNTER — Encounter
Admission: RE | Admit: 2019-08-05 | Discharge: 2019-08-05 | Disposition: A | Discharge status: Home / Self Care | Payer: Managed Care, Other (non HMO) | Source: Ambulatory Visit | Attending: Internal Medicine | Admitting: Internal Medicine

## 2019-08-09 ENCOUNTER — Inpatient Hospital Stay: Admission: RE | Admit: 2019-08-09 | Payer: Managed Care, Other (non HMO) | Source: Ambulatory Visit

## 2019-08-09 ENCOUNTER — Other Ambulatory Visit: Payer: Managed Care, Other (non HMO)

## 2019-08-10 ENCOUNTER — Other Ambulatory Visit: Payer: Managed Care, Other (non HMO)

## 2019-10-11 ENCOUNTER — Ambulatory Visit
Admission: RE | Admit: 2019-10-11 | Discharge: 2019-10-11 | Disposition: A | Discharge status: Home / Self Care | Payer: Managed Care, Other (non HMO) | Source: Ambulatory Visit | Attending: Internal Medicine | Admitting: Internal Medicine

## 2019-10-11 ENCOUNTER — Other Ambulatory Visit: Payer: Self-pay | Admitting: Internal Medicine

## 2019-10-11 ENCOUNTER — Other Ambulatory Visit: Payer: Self-pay

## 2019-10-11 DIAGNOSIS — R1031 Right lower quadrant pain: Secondary | ICD-10-CM

## 2019-10-11 MED ORDER — IOHEXOL 300 MG/ML  SOLN
125.0000 mL | Freq: Once | INTRAMUSCULAR | Status: AC | PRN
  Administered 2019-10-11: 125 mL via INTRAVENOUS

## 2021-01-15 ENCOUNTER — Other Ambulatory Visit: Payer: Self-pay

## 2021-01-15 ENCOUNTER — Other Ambulatory Visit: Payer: Self-pay | Admitting: Internal Medicine

## 2021-01-15 ENCOUNTER — Other Ambulatory Visit (HOSPITAL_COMMUNITY): Payer: Self-pay | Admitting: Internal Medicine

## 2021-01-15 ENCOUNTER — Ambulatory Visit
Admission: RE | Admit: 2021-01-15 | Discharge: 2021-01-15 | Disposition: A | Discharge status: Home / Self Care | Payer: Managed Care, Other (non HMO) | Source: Ambulatory Visit | Attending: Internal Medicine | Admitting: Internal Medicine

## 2021-01-15 DIAGNOSIS — U071 COVID-19: Secondary | ICD-10-CM | POA: Insufficient documentation

## 2021-01-15 DIAGNOSIS — J9601 Acute respiratory failure with hypoxia: Secondary | ICD-10-CM | POA: Diagnosis present

## 2021-01-15 DIAGNOSIS — J1282 Pneumonia due to coronavirus disease 2019: Secondary | ICD-10-CM | POA: Diagnosis present

## 2021-01-15 MED ORDER — IOHEXOL 350 MG/ML SOLN
75.0000 mL | Freq: Once | INTRAVENOUS | Status: AC | PRN
  Administered 2021-01-15: 75 mL via INTRAVENOUS

## 2021-02-14 ENCOUNTER — Other Ambulatory Visit: Payer: Self-pay | Admitting: Physician Assistant

## 2021-02-14 DIAGNOSIS — R1013 Epigastric pain: Secondary | ICD-10-CM
